# Patient Record
Sex: Female | Born: 2000
Health system: Western US, Academic
[De-identification: ages and names within clinical notes are randomized; demographics above are authoritative.]

## PROBLEM LIST (undated history)

## (undated) DIAGNOSIS — K589 Irritable bowel syndrome without diarrhea: Secondary | ICD-10-CM

## (undated) HISTORY — PX: WISDOM TOOTH EXTRACTION: SHX21

## (undated) HISTORY — DX: Irritable bowel syndrome without diarrhea: K58.9

## (undated) HISTORY — PX: ULNAR NERVE REPAIR: SHX2594

## (undated) HISTORY — PX: RHINOPLASTY: SHX2354

---

## 2019-06-29 ENCOUNTER — Other Ambulatory Visit: Payer: Self-pay | Admitting: Primary Care

## 2019-06-29 DIAGNOSIS — Z20822 Contact with and (suspected) exposure to covid-19: Secondary | ICD-10-CM

## 2019-06-30 LAB — NOVEL CORONAVIRUS, NAA: SARS-CoV-2, NAA: NOT DETECTED

## 2020-01-02 ENCOUNTER — Ambulatory Visit: Payer: Self-pay | Attending: Internal Medicine

## 2020-01-02 DIAGNOSIS — Z23 Encounter for immunization: Secondary | ICD-10-CM

## 2020-01-02 NOTE — Progress Notes (Signed)
   Covid-19 Vaccination Clinic  Name:  Latasha Peterson    MRN: [id number] DOB: January 27, 2001  01/02/2020  [name] was observed post Covid-19 immunization for 15 minutes without incident. She was provided with Vaccine Information Sheet and instruction to access the V-Safe system.   [name] was instructed to call 911 with any severe reactions post vaccine: Marland Kitchen Difficulty breathing  . Swelling of face and throat  . A fast heartbeat  . A bad rash all over body  . Dizziness and weakness   Immunizations Administered    Name Date Dose VIS Date Route   Pfizer COVID-19 Vaccine 01/02/2020  3:28 PM 0.3 mL 09/18/2019 Intramuscular   Manufacturer: [employer], [employer]   Lot: [id number]   NDC: [address]-5

## 2020-01-03 ENCOUNTER — Ambulatory Visit: Payer: Self-pay

## 2020-08-29 ENCOUNTER — Ambulatory Visit: Payer: BLUE CROSS/BLUE SHIELD | Attending: Student in an Organized Health Care Education/Training Program

## 2020-08-29 DIAGNOSIS — T7840XD Allergy, unspecified, subsequent encounter: Secondary | ICD-10-CM

## 2020-08-29 DIAGNOSIS — Z0000 Encounter for general adult medical examination without abnormal findings: Secondary | ICD-10-CM

## 2020-08-29 MED ORDER — EPINEPHRINE 0.15 MG/0.3ML IJ SOAJ
0.15 mg | INTRAMUSCULAR | 1 refills | Status: AC | PRN
Start: 2020-08-29 — End: ?

## 2020-08-29 NOTE — Progress Notes
Chelsea Potter Kitchen  [facility]  Outpatient Note  PMD: Leia Alf, MD  08/29/2020    SUBJECTIVE:  Chelsea Potter is a 19 y.o. female who presents for   Chief Complaint   Patient presents with   ??? Establish Care   ??? lightheaded episode     x last week       [id number] is here for annual visit.   Patient is currently in college pre-med, doing well.  Patient has extensive allergies to medication, interested in allergy referral.    Patient Care Team:  Leia Alf, MD as PCP - General      The medical history below was entered and/or reviewed with the patient.    Allergies: Amoxicillin, Oseltamivir, Cefdinir, and Prednisone  Medications:   Patient's last menstrual period was 08/15/2020 (approximate).  OB History   No obstetric history on file.     normal menses, no abnormal bleeding, pelvic pain or discharge,no breast pain or new or enlarging lumps on self exam  Patient denies sexual activity    No past medical history on file.  No past surgical history on file.  Immunization History   Administered Date(s) Administered   ??? COVID-19, mRNA, (Pfizer) 30 mcg/0.3 mL 01/02/2020     Health Maintenance   Topic Date Due   ??? HPV Vaccines (1 - 2-dose series) Never done   ??? Chlamydia/GC Screening  Never done   ??? Hepatitis C Screening  Never done   ??? HIV Screening  Never done   ??? Tdap/Td Vaccine (1 - Tdap) Never done   ??? COVID-19 Vaccine (2 - Pfizer 2-dose series) 01/23/2020   ??? Cervical Ca Screening: PAP Smear  07/30/2020   ??? Influenza Vaccine  Completed     No family history on file.  Social History     Social History Narrative   ??? Not on file     Review of Systems -   A complete 14-system review of system was performed with pertinent positives and negatives included above and in the HPI and otherwise reviewed and found to be negative and/or non-contributory.    OBJECTIVE:  BP 134/83  ~ Pulse 76  ~ Temp 36.4 ???C (97.6 ???F) (Tympanic)  ~ Ht 5' 4.96'' (1.65 m)  ~ Wt 120 lb (54.4 kg)  ~ LMP 08/15/2020 (Approximate)  ~ SpO2 98%  ~ BMI 19.99 kg/m???   General appearance - Alert, non-toxic, well appearing, and in no distress.    Eyes - Pupils are equally round and reactive to light, extra ocular movements are intact, conjunctiva are not injected.  Ears - External ear normal, canal normal with no debris, tympanic membrane translucent without erythema or deformity.  Nose - External nose normal in appearance. Nares patent without discharge or polyps.  Mouth - Mucous membranes moist, pharynx normal without lesions, uvula midline.  Neck - Supple without adenopathy.    Respiratory - Clear to auscultation in anterior and posterior fields, no wheezes, rales or rhonchi, symmetric air movement.  Cardiovascular - Regular rate and rhythm, normal S1 and S2, no murmurs, rubs, clicks or gallops.    Abdomen - Bowel sounds present. Soft, nontender, nondistended, no masses or hepatosplenomegaly.      Back exam - No deformity of spine.   Musculoskeletal - Full range of motion in neck, shoulders, hips and knees.   Extremities - No peripheral edema. Warm and well perfused, capillary refill is less than 3 seconds, no cyanosis. Tibialis anterior  and dorsalis pedis pulses are normal. No clubbing of digits.    Skin - Normal texture  turgor, no rashes, no suspicious skin lesions noted.    Neurological - Alert, oriented. Strength and sensation in upper and lower extremities grossly normal. Biceps and patellar reflexes symmetrical 2+. Normal speech, normal gait.    Psychiatric - Appearance: well dressed, well groomed. Behavior: sits comfortably, answers questions, appropriate eye contact. Attitude: Cooperative. Level of Consciousness: awake and alert. Orientation: Deferred. Speech and Language: Normal rate, rhythm and prosody. Mood: euthymic. Affect: euthymic and consistent with mood. Thought Process/Form: Linear and goal-oriented. Thought Content: As per HPI.  Suicidality and Homicidality: No SI/HI. Insight and Judgment: Fair and appropriate. Attention Span: Normal. Memory: No obvious deficits. Intellectual Functioning: Within normal limits.    Labs:  No results found for this or any previous visit.  Imaging:  No results found.    ASSESSMENT and PLAN:  Pema Thomure is a 19 y.o. female who presents for   Chief Complaint   Patient presents with   ??? Establish Care   ??? lightheaded episode     x last week       ICD-10-CM    1. Allergy, subsequent encounter  T78.40XD Referral to Allergy and Immunology, Medicine     EPINEPHrine (EPIPEN JR) 0.15 mg/0.3 mL injection   2. Well adult exam  Z00.00 CBC & Platelet Count     Comprehensive Metabolic Panel     Lipid Panel     Hgb A1c - HPLC     TSH with reflex FT4, FT3     New or Modified Medications for this Encounter   New    EPINEPHRINE (EPIPEN JR) 0.15 MG/0.3 ML INJECTION    Inject 0.3 mLs (0.15 mg total) into the muscle as needed for for Anaphylaxis.       Associated Diagnoses: Allergy, subsequent encounter    Order Dose: 0.15 mg   Modified    No medications on file   Discontinued Medications    No medications on file     Referral to allergist provided  Epipen provided    F/U labs  Will defer Pap Smear per patient's preference as not ever sexually active    Number and Complexity of Problems Addressed at the Encounter:   Level 4:   []   1 or more chronic illness with exacerbation, progression, or side effects of treatment  []   2 or more stable chronic illness  []   1 undiagnosed new problem with uncertain diagnosis  []   1 acute illness with systemic symptoms  []   1 acute complicated injury   Level 5:   []   1 or more chronic illness with severe exacerbation, progression, or side effects of treatment  []   1 acute or chronic illness or injury that poses a threat to life or bodily function     Review of Data: I have (select 1 out of 3 categories for level 4; 2 out of 3 categories for level 5)  []  Reviewed/ordered []  1 []  2 [x]  ? 3 unique laboratory, radiology, and/or diagnostic tests noted below   []  I have reviewed tests, documents or independent historian(s):  []  Reviewed/ordered ? 3 unique laboratory, radiology, and/or diagnostic tests noted    []  Reviewed prior external notes and incorporated into patient assessment as noted  []  I have independently interpreted test performed by other physician(s) as noted   []  Discussed management or test interpretation with external provider(s) as noted  Risk of Complication and or Morbidity or Mortality of Patient Management including Social Determinants of Health:   []   I deem the above diagnoses to have a risk of complication, morbidity or mortality of []  Minimal   [x]  Low (3)     []  Moderate (4)   []  Severe (5)  []  The diagnosis or treatment of said conditions is significantly limited by social determinants of health as noted.       30  min were spent on this patient visit included physician face-to-face time, preparing to see the patient, counseling and educating patient/family/caregiver, ordering medications/tests/procedures, referring and communicating with other healthcare professionals, documenting clinical information in the EHR and independently interpreting results and communicating results to patient/family/caregiver.          The above plan/recommendation(s) were discussed with the patient. The patient had all questions answered satisfactorily and is in agreement with this recommended plan of care.    # Follow-up  PRN if above symptoms do not resolve or worsen.  No future appointments.    There are no Patient Instructions on file for this visit.    Signed,  Ramiro Harvest, MD   Clinical Instructor  Department of Medicine   1:26 PM 08/29/2020

## 2020-11-21 ENCOUNTER — Ambulatory Visit: Payer: BLUE CROSS/BLUE SHIELD

## 2020-11-21 NOTE — Telephone Encounter
Please advise 

## 2020-12-19 ENCOUNTER — Ambulatory Visit: Payer: BLUE CROSS/BLUE SHIELD | Attending: Student in an Organized Health Care Education/Training Program

## 2020-12-19 DIAGNOSIS — K588 Other irritable bowel syndrome: Secondary | ICD-10-CM

## 2020-12-19 DIAGNOSIS — M940 Chondrocostal junction syndrome [Tietze]: Secondary | ICD-10-CM

## 2020-12-19 DIAGNOSIS — E785 Hyperlipidemia, unspecified: Secondary | ICD-10-CM

## 2020-12-19 MED ORDER — CULTURELLE IMMUNITY SUPPORT PO CAPS
1 | ORAL_CAPSULE | Freq: Every day | ORAL | 3 refills | Status: AC
Start: 2020-12-19 — End: ?

## 2020-12-19 NOTE — Progress Notes
PATIENT: Chelsea Potter  MRN: [id number]  DOB: [birth year]  DATE OF SERVICE: 12/19/2020    CHIEF COMPLAINT:   Chief Complaint   Patient presents with   ??? stomach issues        HPI   [name] is a 20 y.o. female presents for   Chief Complaint   Patient presents with   ??? stomach issues      Patient state she was recently diagnosed w/ costochondritis and treated w/  Meloxicam which has affected her BM. She states in the morning noticed loose stools and then transitions to formed stools. She deneis fever, chills, N/V, hematemsis or hematochezia. 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     Lab review: consistent w/ HLD      MEDS     Medications that the patient states to be currently taking   Medication Sig   ??? azelastine 137 mcg/spray nasal spray    ??? EPINEPHrine (EPIPEN JR) 0.15 mg/0.3 mL injection Inject 0.3 mLs (0.15 mg total) into the muscle as needed for for Anaphylaxis.   ??? Loratadine (CLARITIN PO) Take by mouth.       PHYSICAL EXAM      Last Recorded Vital Signs:    12/19/20 0832   BP: 132/72   Pulse: 92   Temp: 36.6 ???C (97.8 ???F)   SpO2: 99%     Body mass index is 20.62 kg/m???.    System Check if normal Positive or additional negative findings   Constit  [x]  General appearance     Eyes  [x]  Conj/Lids []  Pupils  []  Fundi     HENMT  []  External ears/nose []  Otoscopy   []  Gross Hearing []  Nasal mucosa   []  Lips/teeth/gums []  Oropharynx    []  mucus membranes []  Head     Neck  []  Inspection/palpation []  Thyroid     Resp  []  Effort []  Wheezing    []  Auscultation  []  Crackles     CV  []  Rhythm/rate   []  Murmurs   []  LEE   []  JVP non-elevated    Normal pulses:   []  Radial []  Femoral  []  Pedal     Breast  []  Inspection []  Palpation     GI  []  abd masses    []  tenderness   []  rebound/guarding   []  Liver/spleen []  Rectal     GU  M: []  Scrotum []  Penis []  Prostate   F:  []  External []  vaginal wall        []  Cervix  []  mucus        []  Uterus    []  Adnexa      Lymph  []  Neck []  Axillae []  Groin MSK Specify site examined:    []  Inspect/palp []  ROM   []  Stability []  Strength/tone         Skin  []  Inspection []  Palpation     Neuro  [x]  CN2-12 intact grossly   [x]  Alert and oriented   []  DTR      [x]  Muscle strength      []  Sensation   [x]  Gait/balance     Psych  [x]  Insight/judgement     [x]  Mood/affect    [x]  Gross cognition        LABS/STUDIES   I have:   []  Reviewed/ordered []  1 []  2 [x]  ? 3 unique laboratory, radiology, and/or diagnostic tests noted below   CBC, CMP, HgA1c, Lipid,  TSH  []  Reviewed []  1 []  2 []  ? 3 prior external notes and incorporated into patient assessment    []  Discussed management or test interpretation with external provider(s) as noted       Lab Studies:  Office Visit on 08/29/2020   Component Date Value Ref Range Status   ??? White Blood Cell Count ([facility]) 08/31/2020 7.9  3.8 - 10.8 Thousand/uL Final   ??? Red Blood Cell Count ([facility]) 08/31/2020 4.85  3.80 - 5.10 Million/uL Final   ??? Hemoglobin ([facility]) 08/31/2020 14.1  11.7 - 15.5 g/dL Final   ??? Hematocrit ([facility]) 08/31/2020 43.0  35.0 - 45.0 % Final   ??? MCV ([facility]) 08/31/2020 88.7  80.0 - 100.0 fL Final   ??? MCH ([facility]) 08/31/2020 29.1  27.0 - 33.0 pg Final   ??? MCHC ([facility]) 08/31/2020 32.8  32.0 - 36.0 g/dL Final   ??? RDW ([facility]) 08/31/2020 12.3  11.0 - 15.0 % Final   ??? Platelet Count ([facility]) 08/31/2020 368  140 - 400 Thousand/uL Final   ??? MPV ([facility]) 08/31/2020 10.3  7.5 - 12.5 fL Final   ??? Glucose ([facility]) 08/31/2020 93  65 - 99 mg/dL Final    Comment:               Fasting reference interval        ??? Urea Nitrogen (BUN) ([facility]) 08/31/2020 11  7 - 25 mg/dL Final   ??? Creatinine ([facility]) 08/31/2020 0.88  0.50 - 1.10 mg/dL Final   ??? eGFR Non-Afr. American ([facility]) 08/31/2020 94  > OR = 60 mL/min/1.38m2 Final   ??? eGFR African American ([facility]) 08/31/2020 109  > OR = 60 mL/min/1.39m2 Final   ??? BUN/Creatinine Ratio ([facility]) 08/31/2020 NOT APPLICABLE  6 - 22 (calc) Final   ??? Sodium ([facility]) 08/31/2020 140  135 - 146 mmol/L Final   ??? Potassium ([facility]) 08/31/2020 4.3  3.5 - 5.3 mmol/L Final   ??? Chloride ([facility]) 08/31/2020 102  98 - 110 mmol/L Final   ??? Carbon Dioxide ([facility]) 08/31/2020 28  20 - 32 mmol/L Final   ??? Calcium ([facility]) 08/31/2020 10.3* 8.6 - 10.2 mg/dL Final   ??? Protein, Total ([facility]) 08/31/2020 7.4  6.1 - 8.1 g/dL Final   ??? Albumin ([facility]) 08/31/2020 4.7  3.6 - 5.1 g/dL Final   ??? Globulin ([facility]) 08/31/2020 2.7  1.9 - 3.7 g/dL (calc) Final   ??? Albumin/Globulin Ratio ([facility]) 08/31/2020 1.7  1.0 - 2.5 (calc) Final   ??? Bilirubin, Total ([facility]) 08/31/2020 1.7* 0.2 - 1.2 mg/dL Final   ??? Alkaline Phosphatase ([facility]) 08/31/2020 56  31 - 125 U/L Final   ??? AST ([facility]) 08/31/2020 16  10 - 30 U/L Final   ??? ALT ([facility]) 08/31/2020 15  6 - 29 U/L Final   ??? Cholesterol, Total ([facility]) 08/31/2020 217* <200 mg/dL Final   ??? HDL Cholesterol ([facility]) 08/31/2020 68  > OR = 50 mg/dL Final   ??? Triglycerides ([facility]) 08/31/2020 65  <150 mg/dL Final   ??? LDL-Cholesterol ([facility]) 08/31/2020 133* mg/dL (calc) Final    Comment: Reference range: <100     Desirable range <100 mg/dL for primary prevention;    <70 mg/dL for patients with CHD or diabetic patients   with > or = 2 CHD risk factors.     LDL-C is now calculated using the Martin-Hopkins   calculation, which is a validated novel method providing   better accuracy than the Friedewald equation in the   estimation of  LDL-C.   Horald Pollen et al. Lenox Ahr. 1610;960(45): 2061-2068   (http://education.[facility].com/faq/FAQ164)     ??? Chol/HDLC Ratio ([facility]) 08/31/2020 3.2  <5.0 (calc) Final   ??? Non-HDL Cholesterol ([facility]) 08/31/2020 149* <130 mg/dL (calc) Final    Comment: For patients with diabetes plus 1 major ASCVD risk   factor, treating to a non-HDL-C goal of <100 mg/dL   (LDL-C of <40 mg/dL) is considered a therapeutic   option.     ??? Hemoglobin A1c ([facility]) 08/31/2020 5.1  <5.7 % of total Hgb Final    Comment: For the purpose of screening for the presence of  diabetes:     <5.7%       Consistent with the absence of diabetes  5.7-6.4%    Consistent with increased risk for diabetes              (prediabetes)  > or =6.5%  Consistent with diabetes     This assay result is consistent with a decreased risk  of diabetes.     Currently, no consensus exists regarding use of  hemoglobin A1c for diagnosis of diabetes in children.     According to American Diabetes Association (ADA)  guidelines, hemoglobin A1c <7.0% represents optimal  control in non-pregnant diabetic patients. Different  metrics may apply to specific patient populations.   Standards of Medical Care in Diabetes(ADA).         ??? T3, Free ([facility]) 08/31/2020 3.5  2.3 - 4.2 pg/mL Final   ??? TSH W/Reflex To Ft4 ([facility]) 08/31/2020 2.09  mIU/L Final    Comment:           Reference Range                         > or = 20 Years  0.40-4.50                              Pregnancy Ranges            First trimester    0.26-2.66            Second trimester   0.55-2.73            Third trimester    0.43-2.91         2018 ACC/AHA guidelines recommends that patient is not in statin benefit group. Encourage adherence to heart-healthy lifestyle.    10-Year ASCVD risk cannot be calculated because at least one required variable is not available in [facility] as of 9:02 AM on 12/19/2020.  10-Year ASCVD risk with optimal risk factors cannot be calculated.  Values used to calculate ASCVD score:  Age: 20 y.o. Cannot calculate risk because age is not between 18 and 17 years old.  Gender: Female Race: White or Caucasian  HDL cholesterol: 68 mg/dL. HDL cholesterol measured on 08/31/2020.  Total cholesterol: 217 mg/dL. Total cholesterol measured on 08/31/2020.  LDL cholesterol: 133 mg/dL. LDL cholesterol measured on 08/31/2020.  Systolic BP: 132 mm Hg. BP was measured on 12/19/2020.  The patient is not being treated with a medication that influences SBP.  The patient is currently not a smoker.  The patient does not have a diagnosis of diabetes.  Click here for the [facility] ASCVD Cardiovascular Risk Estimator Plus tool Office manager).    Imaging Studies:   No new imaging reviewed today    A&P   Laqueena Ritter is a 20 y.o. female presenting  for   Chief Complaint   Patient presents with   ??? stomach issues         PROBLEM & ORDERS    ICD-10-CM    1. Other irritable bowel syndrome  K58.8 lactobacillus rhamnosus, GG, CAPS capsule   2. Hyperlipidemia, unspecified hyperlipidemia type  E78.5 Hepatic Funct Panel     Lipid Panel   3. Costochondritis  M94.0        ASSESSMENT  IBS:   Patients varying abdominal pain and changes in BMs likely 2/2 IBS  Counseled patient regarding FODMAPs diet  Provided literature from [facility] and [employer] regarding diet  Provided pro-biotics  RTC in 2 months    HLD:   Counseled regarding diet   Can try red yeast rice extract  Counseled regarding common A/E elevated LFTS  Consider recheck lab draw in 3 months    Costochondritis:   Appreciate management per sports medicine  The above recommendation were discussed with the patient.  The patient has all questions answered satisfactorily and is in agreement with this recommended plan of care.    No follow-ups on file.     Author:  Ramiro Harvest, MD 12/19/2020 9:02 AM

## 2020-12-19 NOTE — Patient Instructions
FODMAPS:  [employer] and [employer] yeast [employer]

## 2020-12-21 ENCOUNTER — Telehealth: Payer: BLUE CROSS/BLUE SHIELD

## 2020-12-21 DIAGNOSIS — K588 Other irritable bowel syndrome: Secondary | ICD-10-CM

## 2020-12-21 MED ORDER — CULTURELLE IMMUNITY SUPPORT PO CAPS
1 | ORAL_CAPSULE | Freq: Every day | ORAL | 3 refills
Start: 2020-12-21 — End: ?

## 2020-12-21 NOTE — Telephone Encounter
Refill Request    Verified patient was seen within the last 6 months. If not seen within 6 months, offered appointment.     Collected prescription information from patient.     Pended orders for provider to review and sign.     Pharmacy location was confirmed and class was set for pended orders.    Patient has been notified of the 24-48 hour turnaround time.      RX never went through electronically in pharmacy

## 2020-12-21 NOTE — Telephone Encounter
[facility]. They need clarification of how many cells you want in the rx ?     Please specify strength/type

## 2020-12-22 ENCOUNTER — Ambulatory Visit: Payer: BLUE CROSS/BLUE SHIELD

## 2021-02-28 ENCOUNTER — Telehealth: Payer: BLUE CROSS/BLUE SHIELD | Attending: Student in an Organized Health Care Education/Training Program

## 2021-02-28 DIAGNOSIS — J069 Acute upper respiratory infection, unspecified: Secondary | ICD-10-CM

## 2021-02-28 NOTE — Progress Notes
RETURN PATIENT TELEMEDICINE VISIT  Provider has determined an electronic visit is appropriate and safe due to the COVID19 crisis    PATIENT:  Chelsea Potter  MRN:  [id number]  DOB:  06/26/2001  DATE OF SERVICE:  02/28/2021  CHIEF COMPLAINT: Cold symptoms      PRIMARY CARE PROVIDER: Ramiro Harvest., MD    Patient Consent to Telehealth Questionnaire   [facility] TELEHEALTH PRECHECKIN QUESTIONS 02/28/2021   By clicking ''I Agree'', I consent to the below:  I Agree     - I agree  to be treated via a video visit and acknowledge that I may be liable for any relevant copays or coinsurance depending on my insurance plan.  - I understand that this video visit is offered for my convenience and I am able to cancel and reschedule for an in-person appointment if I desire.  - I also acknowledge that sensitive medical information may be discussed during this video visit appointment and that it is my responsibility to locate myself in a location that ensures privacy to my own level of comfort.  - I also acknowledge that I should not be participating in a video visit in a way that could cause danger to myself or to those around me (such as driving or walking).  If my provider is concerned about my safety, I understand that they have the right to terminate the visit.     Location of provider: [facility] office  Location of patient: home    Subjective:     Chelsea Potter is a 20 y.o. female with PMHx does not have a problem list on file.. Patient was last seen on 12/19/20; and presents today for video visit to discuss the following issue(s):    Cold symptoms  -Returned to [place] from [facility] via plane flight last Tuesday 02/21/21  -reports nasal congestion, sore throat, cough, ear congestion  -denies fever, chills, n/v/d or other symptoms  -multiple negative COVID home tests  -Tx at home with Dimetapp and Sinus Ex nasal spray    Review of Systems   Per HPI  [x]  10-point ROS reviewed: normal unless stated in HPI    History:   There is no problem list on file for this patient.    History reviewed. No pertinent past medical history.  Current Outpatient Medications   Medication Sig   ??? azelastine 137 mcg/spray nasal spray    ??? EPINEPHrine (EPIPEN JR) 0.15 mg/0.3 mL injection Inject 0.3 mLs (0.15 mg total) into the muscle as needed for for Anaphylaxis.   ??? lactobacillus rhamnosus, GG, CAPS capsule Take 1 capsule by mouth daily.   ??? Loratadine (CLARITIN PO) Take by mouth.     No current facility-administered medications for this visit.     Social History     Socioeconomic History   ??? Marital status: Single   Tobacco Use   ??? Smoking status: Never Smoker   ??? Smokeless tobacco: Never Used   Substance and Sexual Activity   ??? Alcohol use: Yes     Alcohol/week: 1.8 oz     Types: 3 Glasses of wine per week   ??? Drug use: Never     History reviewed. No pertinent family history.    Objective:   Physical Exam:     Vitals: none, given video visit  GEN: sitting in view of the camera in NAD  HEENT: conjunctiva clear, MMM, no rhinorrhea  Neck: trachea midline, full ROM  CV: Appears well-perfused  RESP: no  tachypnea, symmetric chest rise, breathing easily on RA, speaking in full sentences  MSK: normal muscle bulk, no visible deformities  NEURO: alert, interactive, no gross deficits    Labs / Imaging   Lab Studies:  Office Visit on 08/29/2020   Component Date Value Ref Range Status   ??? White Blood Cell Count ([facility]) 08/31/2020 7.9  3.8 - 10.8 Thousand/uL Final   ??? Red Blood Cell Count ([facility]) 08/31/2020 4.85  3.80 - 5.10 Million/uL Final   ??? Hemoglobin ([facility]) 08/31/2020 14.1  11.7 - 15.5 g/dL Final   ??? Hematocrit ([facility]) 08/31/2020 43.0  35.0 - 45.0 % Final   ??? MCV ([facility]) 08/31/2020 88.7  80.0 - 100.0 fL Final   ??? MCH ([facility]) 08/31/2020 29.1  27.0 - 33.0 pg Final   ??? MCHC ([facility]) 08/31/2020 32.8  32.0 - 36.0 g/dL Final   ??? RDW ([facility]) 08/31/2020 12.3  11.0 - 15.0 % Final   ??? Platelet Count ([facility]) 08/31/2020 368  140 - 400 Thousand/uL Final   ??? MPV ([facility]) 08/31/2020 10.3  7.5 - 12.5 fL Final   ??? Glucose ([facility]) 08/31/2020 93  65 - 99 mg/dL Final    Comment:               Fasting reference interval        ??? Urea Nitrogen (BUN) ([facility]) 08/31/2020 11  7 - 25 mg/dL Final   ??? Creatinine ([facility]) 08/31/2020 0.88  0.50 - 1.10 mg/dL Final   ??? eGFR Non-Afr. American ([facility]) 08/31/2020 94  > OR = 60 mL/min/1.91m2 Final   ??? eGFR African American ([facility]) 08/31/2020 109  > OR = 60 mL/min/1.65m2 Final   ??? BUN/Creatinine Ratio ([facility]) 08/31/2020 NOT APPLICABLE  6 - 22 (calc) Final   ??? Sodium ([facility]) 08/31/2020 140  135 - 146 mmol/L Final   ??? Potassium ([facility]) 08/31/2020 4.3  3.5 - 5.3 mmol/L Final   ??? Chloride ([facility]) 08/31/2020 102  98 - 110 mmol/L Final   ??? Carbon Dioxide ([facility]) 08/31/2020 28  20 - 32 mmol/L Final   ??? Calcium ([facility]) 08/31/2020 10.3 (A) 8.6 - 10.2 mg/dL Final   ??? Protein, Total ([facility]) 08/31/2020 7.4  6.1 - 8.1 g/dL Final   ??? Albumin ([facility]) 08/31/2020 4.7  3.6 - 5.1 g/dL Final   ??? Globulin ([facility]) 08/31/2020 2.7  1.9 - 3.7 g/dL (calc) Final   ??? Albumin/Globulin Ratio ([facility]) 08/31/2020 1.7  1.0 - 2.5 (calc) Final   ??? Bilirubin, Total ([facility]) 08/31/2020 1.7 (A) 0.2 - 1.2 mg/dL Final   ??? Alkaline Phosphatase ([facility]) 08/31/2020 56  31 - 125 U/L Final   ??? AST ([facility]) 08/31/2020 16  10 - 30 U/L Final   ??? ALT ([facility]) 08/31/2020 15  6 - 29 U/L Final   ??? Cholesterol, Total ([facility]) 08/31/2020 217 (A) <200 mg/dL Final   ??? HDL Cholesterol ([facility]) 08/31/2020 68  > OR = 50 mg/dL Final   ??? Triglycerides ([facility]) 08/31/2020 65  <150 mg/dL Final   ??? LDL-Cholesterol ([facility]) 08/31/2020 133 (A) mg/dL (calc) Final    Comment: Reference range: <100     Desirable range <100 mg/dL for primary prevention;    <70 mg/dL for patients with CHD or diabetic patients   with > or = 2 CHD risk factors.     LDL-C is now calculated using the Martin-Hopkins   calculation, which is a validated novel method providing   better accuracy than the Friedewald equation in the   estimation of  LDL-C.   Horald Pollen et al. Lenox Ahr. 1478;295(62): 2061-2068   (http://education.[facility].com/faq/FAQ164)     ??? Chol/HDLC Ratio ([facility]) 08/31/2020 3.2  <5.0 (calc) Final   ??? Non-HDL Cholesterol ([facility]) 08/31/2020 149 (A) <130 mg/dL (calc) Final    Comment: For patients with diabetes plus 1 major ASCVD risk   factor, treating to a non-HDL-C goal of <100 mg/dL   (LDL-C of <13 mg/dL) is considered a therapeutic   option.     ??? Hemoglobin A1c ([facility]) 08/31/2020 5.1  <5.7 % of total Hgb Final    Comment: For the purpose of screening for the presence of  diabetes:     <5.7%       Consistent with the absence of diabetes  5.7-6.4%    Consistent with increased risk for diabetes              (prediabetes)  > or =6.5%  Consistent with diabetes     This assay result is consistent with a decreased risk  of diabetes.     Currently, no consensus exists regarding use of  hemoglobin A1c for diagnosis of diabetes in children.     According to American Diabetes Association (ADA)  guidelines, hemoglobin A1c <7.0% represents optimal  control in non-pregnant diabetic patients. Different  metrics may apply to specific patient populations.   Standards of Medical Care in Diabetes(ADA).         ??? T3, Free ([facility]) 08/31/2020 3.5  2.3 - 4.2 pg/mL Final   ??? TSH W/Reflex To Ft4 ([facility]) 08/31/2020 2.09  mIU/L Final    Comment:           Reference Range                         > or = 20 Years  0.40-4.50                              Pregnancy Ranges            First trimester    0.26-2.66            Second trimester   0.55-2.73            Third trimester    0.43-2.91         Imaging Studies:   No imaging has been resulted in the last 30 days      I have:   []  Reviewed/ordered []  1 []  2 [x]  ? 3 unique laboratory, radiology, and/or diagnostic tests noted below    []  Reviewed []  1 []  2 [x]  ? 3 prior external notes and incorporated into patient assessment    []  Discussed management or test interpretation with external provider(s) as noted        Assessment & Plan:     Chelsea Potter is a 20 y.o. female presenting for video visit to discuss cold symptoms      1. Viral URI with cough    While somewhat difficult to fully elucidate patient's symptomatology over telehealth, history and symptomatology consistent with likely viral URI, appreciate multiple negative home COVID tests.  Feel likelihood of bacterial component extremely low at this time.  -discussed supportive care with ibuprofen, Mucinex, nasal saline rinses (ie., Neti pot), moderate fluids, and rest  -discussed masking and excellent hand hygiene  -discussed return precautions    The above plan of care, diagnosis, order, and follow-up were discussed with the patient.  Questions related to this recommended plan of care were answered      Return in about 2 weeks (around 03/14/2021), or if symptoms worsen or fail to improve.      Author: Daria Pastures, MD    02/28/2021 10:49 AM  Family Medicine with Obstetrics  Chelsea Potter  Clinical Instructor, [facility] [employer] [facility] [employer] [facility]  [address], [address]  [place], [place] [address]  Office: [phone]  Fax: [phone]    Please note portions of this note have been composed using FluencyDirect voice dictation software; please excuse any inaccuracies.

## 2021-02-28 NOTE — Patient Instructions
Today you saw Dr. Daria Pastures at [facility]; thank you for trusting [place] with your care.    As a result of today's visit, please consider the following:  -Please continue to wear your mask in public and practice excellent hand hygiene  -You may treat symptoms with ibuprofen, Mucinex, nasal saline rinses (ie., Neti pot), moderate fluids, and rest  -Please proceed to your nearest ER if you develop either or both of the following:     -Fever greater than 100.43F that does not improve with Tylenol     -Significant shortness of breath such that you are winded walking around your home

## 2021-04-24 NOTE — Progress Notes (Signed)
Office Visit Note  Patient: Latasha Peterson             Date of Birth: [birth year]           MRN: [id number]             PCP: Irine Seal, MD Referring: Irine Seal, MD Visit Date: 04/25/2021 Occupation: Pre-med student  Subjective:  New Patient (Initial Visit) (Knot on sternum, abnormal labs)   History of Present Illness: Latasha Peterson is a 20 y.o. female here for sternal pain.  She reports pain in the anterior chest with a palpable bony nodule noticeable for the past about 1.5 years.  Symptoms have increased during the past 6 months with pain intermittently sometimes not bothering her at all but other times she will be unable to raise her arm overhead due to anterior chest pain.  This has been evaluated at several encounters with work-up including ultrasound x-ray and MRI imaging which were all fairly nonspecific thought to represent osteoarthritis or costochondritis.  Lab test check for this problem demonstrated normal sedimentation rate and CRP she had a low positive ANA 1:40. She has tried meloxicam for this problem, that caused exacerbation of her chronic IBS diarrhea predominant symptoms.  Topical Voltaren was not significantly beneficial. 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  She remains physically active exercising regularly with interest in orthopedics and exercise physiology.  She has had previous joint injuries related to these physical activity but never required major procedure or surgery. She has a personal history of allergic reaction to prednisone treatment with which she describes as linear pattern of rashes on her extremities and joint swelling.  Her family history significant for early age of arthritis but no known autoimmune disease.  Activities of Daily Living:  Patient reports morning stiffness for 0  none .   Patient Reports nocnonturnal pain.  Difficulty dressing/grooming:  Denies Difficulty climbing stairs: Denies Difficulty getting out of chair: Denies Difficulty using hands for taps, buttons, cutlery, and/or writing: Denies  Review of Systems  Constitutional:  Negative for fatigue.  HENT:  Negative for mouth dryness.   Eyes:  Negative for dryness.  Respiratory:  Negative for shortness of breath.   Cardiovascular:  Negative for swelling in legs/feet.  Gastrointestinal:  Negative for constipation.  Endocrine: Negative for excessive thirst.  Genitourinary:  Negative for difficulty urinating.  Musculoskeletal:  Negative for joint pain and joint pain.  Skin:  Negative for rash.  Allergic/Immunologic: Negative for susceptible to infections.  Neurological:  Negative for weakness.  Hematological:  Negative for bruising/bleeding tendency.  Psychiatric/Behavioral:  Negative for sleep disturbance.    PMFS History:  Patient Active Problem List   Diagnosis Date Noted   Irritable bowel syndrome (IBS) 04/25/2021   Anterior chest wall pain 04/25/2021   Chronic inflammatory arthritis 04/25/2021   Chronic back pain 04/25/2021   Positive ANA (antinuclear antibody) 04/25/2021    Past Medical History:  Diagnosis Date   IBS (irritable bowel syndrome)     Family History  Problem Relation Age of Onset   Osteoarthritis Mother    Past Surgical History:  Procedure Laterality Date   RHINOPLASTY     ULNAR NERVE REPAIR     WISDOM TOOTH EXTRACTION     Social History   Social History Narrative   Not on file   Immunization History  Administered Date(s) Administered   PFIZER(Purple Top)SARS-COV-2 Vaccination 01/02/2020  Objective: Vital Signs: BP 102/61 (BP Location: Right Arm, Patient Position: Sitting, Cuff Size: Normal)   Pulse 69   Resp 14   Ht 5' 5"  (1.651 m)   Wt 123 lb (55.8 kg)   BMI 20.47 kg/m    Physical Exam HENT:     Right Ear: External ear normal.     Left Ear: External ear normal.     Mouth/Throat:     Mouth: Mucous membranes are  moist.     Pharynx: Oropharynx is clear.  Eyes:     Conjunctiva/sclera: Conjunctivae normal.  Cardiovascular:     Rate and Rhythm: Normal rate and regular rhythm.  Pulmonary:     Effort: Pulmonary effort is normal.     Breath sounds: Normal breath sounds.  Skin:    General: Skin is warm and dry.     Findings: No rash.  Neurological:     Mental Status: She is alert.     Deep Tendon Reflexes: Reflexes normal.  Psychiatric:        Mood and Affect: Mood normal.     Musculoskeletal Exam:  Neck full ROM no tenderness Shoulders full ROM no tenderness or swelling Elbows full ROM no tenderness or swelling Wrists full ROM no tenderness or swelling Fingers full ROM no tenderness or swelling Knees full ROM no tenderness or swelling Ankles full ROM no tenderness or swelling Bony prominence of the manubriosternal joint without tenderness to palpation, ultrasound inspection demonstrates some possible chronic joint capsule thickening without obvious effusion, color doppler enhancement present along inferior border   Investigation: No additional findings.  Imaging: No results found.  Recent Labs: No results found for: WBC, HGB, PLT, NA, K, CL, CO2, GLUCOSE, BUN, CREATININE, BILITOT, ALKPHOS, AST, ALT, PROT, ALBUMIN, CALCIUM, GFRAA, QFTBGOLD, QFTBGOLDPLUS  Speciality Comments: No specialty comments available.  Procedures:  No procedures performed Allergies: Amoxicillin, Oseltamivir, Cefdinir, and Prednisone   Assessment / Plan:     Visit Diagnoses: Anterior chest wall pain Chronic inflammatory arthritis - Plan: HLA-B27 antigen, Rheumatoid factor  Chronic anterior chest pain localized around the manubriosternal joint this sounds inflammatory in character with episodic periods of exacerbation and increased swelling described.  Primary osteoarthritis of the site would be uncommon for a 20 year old and she denies any specific trauma or injury at this site.  Could have had some chronic  overuse with her history of intense physical activity and exercise but seems out of proportion.  We will check HLA-B27 allele symptom could be seen in axial spondylarthritis.  Also rechecking rheumatoid factor.  I would have low threshold for further work-up including whole-body bone scan.  Positive ANA (antinuclear antibody)  Positive ANA at low titer 1:40 no other clinical evidence of inflammatory disease on exam or by history today.  Do not recommend further extensive work-up at this time with low clinical suspicion.  Orders: Orders Placed This Encounter  Procedures   HLA-B27 antigen   Rheumatoid factor   No orders of the defined types were placed in this encounter.    Follow-Up Instructions: No follow-ups on file.   Collier Salina, MD  Note - This record has been created using [employer].  Chart creation errors have been sought, but may not always  have been located. Such creation errors do not reflect on  the standard of medical care.

## 2021-04-25 ENCOUNTER — Encounter: Payer: Self-pay | Admitting: Internal Medicine

## 2021-04-25 ENCOUNTER — Other Ambulatory Visit: Payer: Self-pay

## 2021-04-25 ENCOUNTER — Ambulatory Visit (INDEPENDENT_AMBULATORY_CARE_PROVIDER_SITE_OTHER): Payer: BLUE CROSS/BLUE SHIELD | Admitting: Internal Medicine

## 2021-04-25 VITALS — BP 102/61 | HR 69 | Resp 14 | Ht 65.0 in | Wt 123.0 lb

## 2021-04-25 DIAGNOSIS — M1990 Unspecified osteoarthritis, unspecified site: Secondary | ICD-10-CM | POA: Insufficient documentation

## 2021-04-25 DIAGNOSIS — R768 Other specified abnormal immunological findings in serum: Secondary | ICD-10-CM

## 2021-04-25 DIAGNOSIS — R0789 Other chest pain: Secondary | ICD-10-CM | POA: Diagnosis not present

## 2021-04-25 DIAGNOSIS — M549 Dorsalgia, unspecified: Secondary | ICD-10-CM | POA: Diagnosis not present

## 2021-04-25 DIAGNOSIS — K589 Irritable bowel syndrome without diarrhea: Secondary | ICD-10-CM | POA: Insufficient documentation

## 2021-04-25 DIAGNOSIS — G8929 Other chronic pain: Secondary | ICD-10-CM | POA: Insufficient documentation

## 2021-04-25 NOTE — Patient Instructions (Addendum)
Human Leukocyte Antigen B27 Test Why am I having this test? The human leukocyte antigen B27 (HLA-B27) test is performed to help your health care provider diagnose certain autoimmune diseases, especially an autoimmunedisease called ankylosing spondylitis. Autoimmune diseases are caused by an abnormality in your body's defense (immune) system. Normally, your immune system attacks germs and other things that can make you sick. When you have an autoimmune disease, your immune system attacksthe normal cells of your body instead. Ankylosing spondylitis causes long-term (chronic) pain and stiffness in the spine and hips and can also affect other parts ofthe body, such as the eyes and gastrointestinal tract. What is being tested? This test checks your blood for the presence of HLA-B27, which is a protein found on the surface of white blood cells. HLA-B27 is commonly found in peoplewho have ankylosing spondylitis. What kind of sample is taken?  A blood sample is required for this test. It is usually collected by insertinga needle into a blood vessel. How are the results reported? Your test results will be reported as either positive or negative for thepresence of HLA-B27 in your blood. What do the results mean? A negative result means that you are less likely to have ankylosing spondylitis or certain other autoimmune diseases. However, having a negative result does not mean that you do not have one of these diseases. A positive result means that you are more likely to have: Ankylosing spondylitis. Other autoimmune diseases, such as: Rheumatoid arthritis. Reactive arthritis. Psoriasis or psoriatic arthritis. Inflammation of the eyes (uveitis). Inflammatory bowel disease. HLA-B27 is normally found in a small number of people without a disease. Having HLA-B27 does not always mean that you have an autoimmune disease. Your health care provider will use these test results along with other test results andyour  medical history to make a diagnosis. Talk with your health care provider about what your results mean. Questions to ask your health care provider Ask your health care provider, or the department that is doing the test: When will my results be ready? How will I get my results? What are my treatment options? What other tests do I need? What are my next steps? Summary The human leukocyte antigen B27 (HLA-B27) test is performed to help your health care provider diagnose a condition called ankylosing spondylitis. Ankylosing spondylitis causes long-term (chronic) pain and stiffness in the spine and hips and can also affect other parts of the body, such as the eyes and gastrointestinal tract. A negative result means that you are less likely to have ankylosing spondylitis, while a positive result means that you are more likely to have this condition or another autoimmune condition. Talk with your health care provider about what your results mean. This information is not intended to replace advice given to you by your health care provider. Make sure you discuss any questions you have with your healthcare provider. Document Revised: 08/04/2020 Document Reviewed: 08/04/2020 Elsevier Patient Education  2022 [employer].

## 2021-04-26 ENCOUNTER — Telehealth: Payer: Self-pay | Admitting: Internal Medicine

## 2021-04-26 LAB — RHEUMATOID FACTOR: Rheumatoid fact SerPl-aCnc: <14 IU/mL (ref <14)

## 2021-04-26 LAB — HLA-B27 ANTIGEN: HLA-B27 Antigen: NEGATIVE

## 2021-04-26 NOTE — Progress Notes (Signed)
Lab tests for arthritis related antibodies were negative. I recommend next step to go for a bone scan study to look at areas of joint inflammation involvement like we talked about in clinic. If she agrees with this I will go ahead and order it.

## 2021-04-28 NOTE — Telephone Encounter (Signed)
Duplicate entry

## 2021-05-01 ENCOUNTER — Encounter: Payer: Self-pay | Admitting: Internal Medicine

## 2021-05-02 NOTE — Telephone Encounter (Signed)
I was reviewing ordering for appropriate study and order is now placed today.

## 2021-05-02 NOTE — Addendum Note (Signed)
Addended by: Fuller Plan on: 05/02/2021 08:05 AM   Modules accepted: Orders

## 2021-05-02 NOTE — Progress Notes (Signed)
I was reviewing correct ordering, it is now placed today.

## 2021-05-29 ENCOUNTER — Other Ambulatory Visit: Payer: Self-pay

## 2021-05-29 ENCOUNTER — Encounter (HOSPITAL_COMMUNITY)
Admission: RE | Admit: 2021-05-29 | Discharge: 2021-05-29 | Disposition: A | Discharge status: Home / Self Care | Payer: BLUE CROSS/BLUE SHIELD | Source: Ambulatory Visit | Attending: Internal Medicine | Admitting: Internal Medicine

## 2021-05-29 DIAGNOSIS — M549 Dorsalgia, unspecified: Secondary | ICD-10-CM | POA: Insufficient documentation

## 2021-05-29 DIAGNOSIS — R0789 Other chest pain: Secondary | ICD-10-CM | POA: Diagnosis not present

## 2021-05-29 DIAGNOSIS — M1990 Unspecified osteoarthritis, unspecified site: Secondary | ICD-10-CM | POA: Diagnosis present

## 2021-05-29 DIAGNOSIS — R768 Other specified abnormal immunological findings in serum: Secondary | ICD-10-CM | POA: Diagnosis present

## 2021-05-29 DIAGNOSIS — G8929 Other chronic pain: Secondary | ICD-10-CM | POA: Diagnosis present

## 2021-05-29 IMAGING — NM NM BONE WHOLE BODY
2 series · 2 of 2 positions shown · non-contrast
Comparison: None.

CLINICAL DATA: Manubriosternal arthritis, suspect axial arthropathy
or SAPHO syndrome.

EXAM:
NUCLEAR MEDICINE WHOLE BODY BONE SCAN
TECHNIQUE: Whole body anterior and posterior images were obtained approximately
3 hours after intravenous injection of radiopharmaceutical.
RADIOPHARMACEUTICALS:  20.6 mCi [51] MDP IV

[Series 1: whole body · 2.66mm/px · 1 of 1 slices shown (1 of 2)]
[im 1/1]
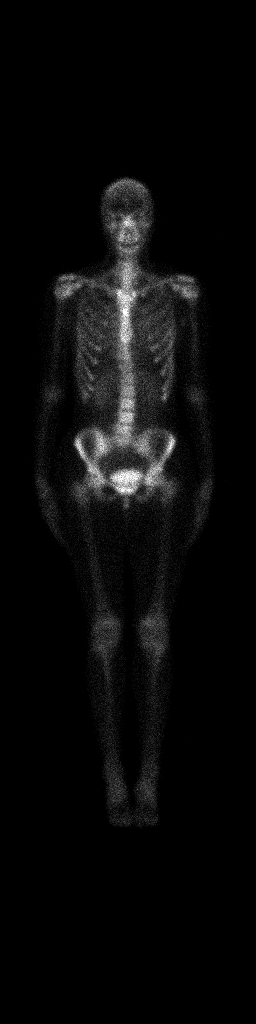

[Series 1: whole body · 2.66mm/px · 1 of 1 slices shown (2 of 2)]
[im 1/1]
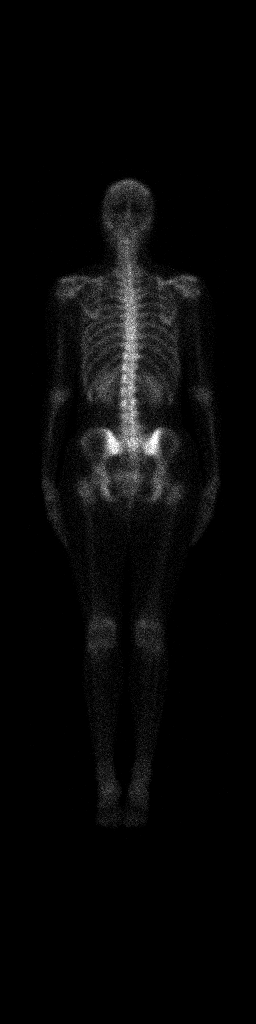

[2 of 2 positions shown; findings below may reference images not displayed]

FINDINGS: There is mild, symmetric focal radiotracer uptake of the bilateral
sternoclavicular joints, as well as a subtle focus of radiotracer
uptake at the sternomanubrial junction. No other abnormal
radiotracer uptake. Expected urinary tract uptake and excretion.
IMPRESSION: There is mild, symmetric focal radiotracer uptake of the bilateral
sternoclavicular joints, as well as a subtle focus of radiotracer
uptake at the sternomanubrial junction. Findings are nonspecific but
potentially in keeping with suspicion for SAPHO syndrome

## 2021-05-29 MED ORDER — TECHNETIUM TC 99M MEDRONATE IV KIT
20.0000 | PACK | Freq: Once | INTRAVENOUS | Status: AC | PRN
  Administered 2021-05-29: 20 via INTRAVENOUS

## 2021-06-05 ENCOUNTER — Encounter: Payer: Self-pay | Admitting: Internal Medicine

## 2021-06-07 NOTE — Telephone Encounter (Signed)
I recommend we schedule a follow up visit I would like to take another look and discuss findings also need to discuss medications for treatment options.

## 2021-06-18 NOTE — Progress Notes (Signed)
Office Visit Note  Patient: Latasha Peterson             Date of Birth: 03-05-2001           MRN: [id number]             PCP: Gust Rung, MD Referring: Gust Rung, MD Visit Date: 06/19/2021   Subjective:  Follow-up (Patient is here to review results of bone scan.)   History of Present Illness: Latasha Peterson is a 20 y.o. female here for follow up for chronic manubriosternal pain with nonspecific although possibly consistent with auto inflammatory disease activity on bone scan. No other areas of inflammatory activity were noted.  Her symptoms remain about the same with a very localized pain in the center of the chest sometimes aggravating her with direct pressure or when having to lift significant weight with her shoulders.  Previous HPI 04/25/21 Latasha Peterson is a 20 y.o. female here for sternal pain.  She reports pain in the anterior chest with a palpable bony nodule noticeable for the past about 1.5 years.  Symptoms have increased during the past 6 months with pain intermittently sometimes not bothering her at all but other times she will be unable to raise her arm overhead due to anterior chest pain.  This has been evaluated at several encounters with work-up including ultrasound x-ray and MRI imaging which were all fairly nonspecific thought to represent osteoarthritis or costochondritis.  Lab test check for this problem demonstrated normal sedimentation rate and CRP she had a low positive ANA 1:40. She has tried meloxicam for this problem, that caused exacerbation of her chronic IBS diarrhea predominant symptoms.  Topical Voltaren was not significantly beneficial. 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  She remains physically active exercising regularly with interest in orthopedics and exercise physiology.  She has had previous joint injuries related to these physical activity but never required major  procedure or surgery. She has a personal history of allergic reaction to prednisone treatment with which she describes as linear pattern of rashes on her extremities and joint swelling.  Her family history significant for early age of arthritis but no known autoimmune disease.   Review of Systems  Constitutional:  Negative for fatigue.  HENT:  Negative for mouth sores, mouth dryness and nose dryness.   Eyes:  Negative for pain, itching, visual disturbance and dryness.  Respiratory:  Negative for cough, hemoptysis, shortness of breath and difficulty breathing.   Cardiovascular:  Negative for chest pain, palpitations and swelling in legs/feet.  Gastrointestinal:  Negative for abdominal pain, blood in stool, constipation and diarrhea.  Endocrine: Negative for increased urination.  Genitourinary:  Negative for painful urination.  Musculoskeletal:  Positive for joint pain and joint pain. Negative for joint swelling, myalgias, muscle weakness, morning stiffness, muscle tenderness and myalgias.  Skin:  Negative for color change, rash and redness.  Allergic/Immunologic: Negative for susceptible to infections.  Neurological:  Negative for dizziness, numbness, headaches, memory loss and weakness.  Hematological:  Negative for swollen glands.  Psychiatric/Behavioral:  Negative for confusion and sleep disturbance.    PMFS History:  Patient Active Problem List   Diagnosis Date Noted   High risk medication use 06/19/2021   Irritable bowel syndrome (IBS) 04/25/2021   Anterior chest wall pain 04/25/2021   Chronic inflammatory arthritis 04/25/2021   Chronic back pain 04/25/2021   Positive ANA (antinuclear antibody) 04/25/2021  Past Medical History:  Diagnosis Date   IBS (irritable bowel syndrome)     Family History  Problem Relation Age of Onset   Osteoarthritis Mother    Past Surgical History:  Procedure Laterality Date   RHINOPLASTY     ULNAR NERVE REPAIR     WISDOM TOOTH EXTRACTION      Social History   Social History Narrative   Not on file   Immunization History  Administered Date(s) Administered   PFIZER(Purple Top)SARS-COV-2 Vaccination 01/02/2020     Objective: Vital Signs: BP 123/77 (BP Location: Left Arm, Patient Position: Sitting, Cuff Size: Normal)   Pulse 72   Ht 5' 5.5" (1.664 m)   Wt 123 lb 9.6 oz (56.1 kg)   BMI 20.26 kg/m    Physical Exam Skin:    General: Skin is warm and dry.     Findings: No rash.  Neurological:     Mental Status: She is alert.  Psychiatric:        Mood and Affect: Mood normal.     Musculoskeletal Exam:  Bony prominence of rmanubriosternal joint without significant tenderness to palpation, no erythema or warmth or overlying swelling   Investigation: No additional findings.  Imaging: No results found.  Recent Labs: Lab Results  Component Value Date   NA 139 06/19/2021   K 4.3 06/19/2021   CL 101 06/19/2021   CO2 29 06/19/2021   GLUCOSE 83 06/19/2021   BUN 14 06/19/2021   CREATININE 1.02 (H) 06/19/2021   BILITOT 1.4 (H) 06/19/2021   AST 21 06/19/2021   ALT 20 06/19/2021   PROT 8.1 06/19/2021   CALCIUM 10.3 (H) 06/19/2021   QFTBGOLDPLUS NEGATIVE 06/19/2021    Speciality Comments: No specialty comments available.  Procedures:  Small Joint Inj on 06/19/2021 3:30 PM Indications: pain Details: 25 G needle, ultrasound-guided (anterior) approach Medications: 0.25 mL lidocaine 1 %; 10 mg triamcinolone acetonide 40 MG/ML Outcome: tolerated well, no immediate complications  Needle difficult to advance into joint space due to marginal bone spurring, approximately 0.5 mL total volume was able to be injected Consent was given by the patient. Immediately prior to procedure a time out was called to verify the correct patient, procedure, equipment, support staff and site/side marked as required. Patient was prepped and draped in the usual sterile fashion.    Allergies: Amoxicillin, Oseltamivir, Cefdinir, and  Prednisone   Assessment / Plan:     Visit Diagnoses: Positive ANA (antinuclear antibody)  Anterior chest wall pain - Plan: Small Joint Inj  Findings remain very suggestive for an inflammatory arthritis but is localized to single joint involvement based on symptoms and the bone scan test.  So far chronic use of nonsteroidal anti-inflammatories has not been satisfactory results.  Discussed local versus systemic treatments after discussion procedure for local steroid joint injection.  We will follow-up in about 4 weeks to check on the response to this treatment.  High risk medication use - Plan: Hepatitis B surface antigen, Hepatitis B core antibody, IgM, Hepatitis C antibody, QuantiFERON-TB Gold Plus, COMPLETE METABOLIC PANEL WITH GFR  Discussed if symptoms are inadequately improved by local steroid injection or nonsteroidal anti-inflammatories would be candidate for trial of DMARD treatment.  We will check baseline labs including hepatitis serology complete metabolic panel and TB screening for this.  Orders: Orders Placed This Encounter  Procedures   Small Joint Inj   Hepatitis B surface antigen   Hepatitis B core antibody, IgM   Hepatitis C antibody   QuantiFERON-TB Gold  Plus   COMPLETE METABOLIC PANEL WITH GFR    No orders of the defined types were placed in this encounter.    Follow-Up Instructions: Return in about 4 weeks (around 07/17/2021).   Fuller Plan, MD  Note - This record has been created using [employer].  Chart creation errors have been sought, but may not always  have been located. Such creation errors do not reflect on  the standard of medical care.

## 2021-06-19 ENCOUNTER — Encounter: Payer: Self-pay | Admitting: Internal Medicine

## 2021-06-19 ENCOUNTER — Ambulatory Visit (INDEPENDENT_AMBULATORY_CARE_PROVIDER_SITE_OTHER): Payer: BLUE CROSS/BLUE SHIELD | Admitting: Internal Medicine

## 2021-06-19 ENCOUNTER — Other Ambulatory Visit: Payer: Self-pay

## 2021-06-19 VITALS — BP 123/77 | HR 72 | Ht 65.5 in | Wt 123.6 lb

## 2021-06-19 DIAGNOSIS — Z79899 Other long term (current) drug therapy: Secondary | ICD-10-CM | POA: Diagnosis not present

## 2021-06-19 DIAGNOSIS — R768 Other specified abnormal immunological findings in serum: Secondary | ICD-10-CM | POA: Diagnosis not present

## 2021-06-19 DIAGNOSIS — M1990 Unspecified osteoarthritis, unspecified site: Secondary | ICD-10-CM | POA: Diagnosis not present

## 2021-06-19 DIAGNOSIS — R0789 Other chest pain: Secondary | ICD-10-CM

## 2021-06-19 NOTE — Patient Instructions (Signed)
Joint Steroid Injection A joint steroid injection is a procedure to relieve swelling and pain in a joint. Steroids are medicines that reduce inflammation. In this procedure, your health care provider uses a syringe and a needle to inject a steroid medicine into a painful and inflamed joint. A pain-relieving medicine (anesthetic) may be injected along with the steroid. In some cases, your health care provider may use an imaging technique such as ultrasound or fluoroscopy toguide the injection. Joints that are often treated with steroid injections include the knee, shoulder, hip, and spine. These injections may also be used in the elbow, ankle, and joints of the hands or feet. You may have joint steroid injections as part of your treatment for inflammation caused by: Gout. Rheumatoid arthritis. Advanced wear-and-tear arthritis (osteoarthritis). Tendinitis. Bursitis. Joint steroid injections may be repeated, but having them too often can damage a joint or the skin over the joint. You should not have joint steroidinjections less than 6 weeks apart or more than four times a year. Tell a health care provider about: Any allergies you have. All medicines you are taking, including vitamins, herbs, eye drops, creams, and over-the-counter medicines. Any problems you or family members have had with anesthetic medicines. Any blood disorders you have. Any surgeries you have had. Any medical conditions you have. Whether you are pregnant or may be pregnant. What are the risks? Generally, this is a safe treatment. However, problems may occur, including: Infection. Bleeding. Allergic reactions to medicines. Damage to the joint or tissues around the joint. Thinning of skin or loss of skin color over the joint. Temporary flushing of the face or chest. Temporary increase in pain. Temporary increase in blood sugar. Failure to relieve inflammation or pain. What happens before the treatment? Medicines Ask  your health care provider about: Changing or stopping your regular medicines. This is especially important if you are taking diabetes medicines or blood thinners. Taking medicines such as aspirin and ibuprofen. These medicines can thin your blood. Do not take these medicines unless your health care provider tells you to take them. Taking over-the-counter medicines, vitamins, herbs, and supplements. General instructions You may have imaging tests of your joint. Ask your health care provider if you can drive yourself home after the procedure. What happens during the treatment?  Your health care provider will position you for the injection and locate the injection site over your joint. The skin over the joint will be cleaned with a germ-killing soap. Your health care provider may: Spray a numbing solution (topical anesthetic) over the injection site. Inject a local anesthetic under the skin above your joint. The needle will be placed through your skin into your joint. Your health care provider may use imaging to guide the needle to the right spot for the injection. If imaging is used, a special contrast dye may be injected to confirm that the needle is in the correct location. The steroid medicine will be injected into your joint. Anesthetic may be injected along with the steroid. This may be a medicine that relieves pain for a short time (short-acting anesthetic) or for a longer time (long-acting anesthetic). The needle will be removed, and an adhesive bandage (dressing) will be placed over the injection site. The procedure may vary among health care providers and hospitals. What can I expect after the treatment? You will be able to go home after the treatment. It is normal to feel slight flushing for a few days after the injection. After the treatment, it is common to   have an increase in joint pain after the anesthetic has worn off. This may happen about an hour after a short-acting anesthetic  or about 8 hours after a longer-acting anesthetic. You should begin to feel relief from joint pain and swelling after 24 to 48 hours. Contact your health care provider if you do not begin to feel relief after 2 days. Follow these instructions at home: Injection site care Leave the adhesive dressing over your injection site in place until your health care provider says you can remove it. Check your injection site every day for signs of infection. Check for: More redness, swelling, or pain. Fluid or blood. Warmth. Pus or a bad smell. Activity Return to your normal activities as told by your health care provider. Ask your health care provider what activities are safe for you. You may be asked to limit activities that put stress on the joint for a few days. Do joint exercises as told by your health care provider. Do not take baths, swim, or use a hot tub until your health care provider approves. Ask your health care provider if you may take showers. You may only be allowed to take sponge baths. Managing pain, stiffness, and swelling  If directed, put ice on the joint. To do this: Put ice in a plastic bag. Place a towel between your skin and the bag. Leave the ice on for 20 minutes, 2-3 times a day. Remove the ice if your skin turns bright red. This is very important. If you cannot feel pain, heat, or cold, you have a greater risk of damage to the area. Raise (elevate) your joint above the level of your heart when you are sitting or lying down.  General instructions Take over-the-counter and prescription medicines only as told by your health care provider. Do not use any products that contain nicotine or tobacco, such as cigarettes, e-cigarettes, and chewing tobacco. These can delay joint healing. If you need help quitting, ask your health care provider. If you have diabetes, be aware that your blood sugar may be slightly elevated for several days after the injection. Keep all follow-up visits.  This is important. Contact a health care provider if you have: Chills or a fever. Any signs of infection at your injection site. Increased pain or swelling or no relief after 2 days. Summary A joint steroid injection is a treatment to relieve pain and swelling in a joint. Steroids are medicines that reduce inflammation. Your health care provider may add an anesthetic along with the steroid. You may have joint steroid injections as part of your arthritis treatment. Joint steroid injections may be repeated, but having them too often can damage a joint or the skin over the joint. Contact your health care provider if you have a fever, chills, or signs of infection, or if you get no relief from joint pain or swelling. This information is not intended to replace advice given to you by your health care provider. Make sure you discuss any questions you have with your healthcare provider. Document Revised: 03/04/2020 Document Reviewed: 03/04/2020 Elsevier Patient Education  2022 [employer]  

## 2021-06-22 LAB — HEPATITIS B CORE ANTIBODY, IGM: Hep B C IgM: NONREACTIVE

## 2021-06-22 LAB — COMPLETE METABOLIC PANEL WITH GFR
AG Ratio: 1.9 (calc) (ref 1.0–2.5)
ALT: 20 U/L (ref 6–29)
AST: 21 U/L (ref 10–30)
Albumin: 5.3 g/dL — ABNORMAL HIGH (ref 3.6–5.1)
Alkaline phosphatase (APISO): 66 U/L (ref 31–125)
BUN/Creatinine Ratio: 14 (calc) (ref 6–22)
BUN: 14 mg/dL (ref 7–25)
CO2: 29 mmol/L (ref 20–32)
Calcium: 10.3 mg/dL — ABNORMAL HIGH (ref 8.6–10.2)
Chloride: 101 mmol/L (ref 98–110)
Creat: 1.02 mg/dL — ABNORMAL HIGH (ref 0.50–0.96)
Globulin: 2.8 g/dL (calc) (ref 1.9–3.7)
Glucose, Bld: 83 mg/dL (ref 65–99)
Potassium: 4.3 mmol/L (ref 3.5–5.3)
Sodium: 139 mmol/L (ref 135–146)
Total Bilirubin: 1.4 mg/dL — ABNORMAL HIGH (ref 0.2–1.2)
Total Protein: 8.1 g/dL (ref 6.1–8.1)
eGFR: 80 mL/min/{1.73_m2} (ref >=60)

## 2021-06-22 LAB — HEPATITIS B SURFACE ANTIGEN: Hepatitis B Surface Ag: NONREACTIVE

## 2021-06-22 LAB — QUANTIFERON-TB GOLD PLUS
Mitogen-NIL: 5.07 IU/mL
NIL: 0.03 IU/mL
QuantiFERON-TB Gold Plus: NEGATIVE
TB1-NIL: 0.01 IU/mL
TB2-NIL: 0.01 IU/mL

## 2021-06-22 LAB — HEPATITIS C ANTIBODY
Hepatitis C Ab: NONREACTIVE
SIGNAL TO CUT-OFF: 0.01 (ref <1.00)

## 2021-07-08 MED ORDER — LIDOCAINE HCL 1 % IJ SOLN
0.2500 mL | INTRAMUSCULAR | Status: AC | PRN
  Administered 2021-06-19: .25 mL

## 2021-07-08 MED ORDER — TRIAMCINOLONE ACETONIDE 40 MG/ML IJ SUSP
10.0000 mg | INTRAMUSCULAR | Status: AC | PRN
  Administered 2021-06-19: 10 mg via INTRA_ARTICULAR

## 2021-07-17 ENCOUNTER — Encounter: Payer: Self-pay | Admitting: Emergency Medicine

## 2021-07-17 ENCOUNTER — Ambulatory Visit
Admission: EM | Admit: 2021-07-17 | Discharge: 2021-07-17 | Disposition: A | Discharge status: Home / Self Care | Payer: BLUE CROSS/BLUE SHIELD | Attending: Emergency Medicine | Admitting: Emergency Medicine

## 2021-07-17 ENCOUNTER — Other Ambulatory Visit: Payer: Self-pay

## 2021-07-17 DIAGNOSIS — B9689 Other specified bacterial agents as the cause of diseases classified elsewhere: Secondary | ICD-10-CM | POA: Diagnosis not present

## 2021-07-17 DIAGNOSIS — J329 Chronic sinusitis, unspecified: Secondary | ICD-10-CM | POA: Diagnosis not present

## 2021-07-17 MED ORDER — DOXYCYCLINE HYCLATE 100 MG PO CAPS: 100.0000 mg | ORAL_CAPSULE | Freq: Two times a day (BID) | ORAL | 0 refills | Status: AC

## 2021-07-17 NOTE — ED Provider Notes (Signed)
CHIEF COMPLAINT:   Chief Complaint  Patient presents with   Nasal Congestion   Facial Pain     SUBJECTIVE/HPI:  HPI A very pleasant 19 y.o.Female presents today with sinus congestion and drainage along with pressure over the last week. Patient does not report any shortness of breath, chest pain, palpitations, visual changes, weakness, tingling, headache, nausea, vomiting, diarrhea, fever, chills.   has a past medical history of IBS (irritable bowel syndrome).  ROS:  Review of Systems See Subjective/HPI Medications, Allergies and Problem List personally reviewed in Epic today OBJECTIVE:   Vitals:   07/17/21 1934  BP: 131/87  Pulse: 75  Resp: 20  Temp: 98.2 F (36.8 C)  SpO2: 98%    Physical Exam   General: Appears well-developed and well-nourished. No acute distress.  HEENT Head: Normocephalic and atraumatic.  + frontal, maxillary sinus tenderness noted to palpation. Ears: Hearing grossly intact, no drainage or visible deformity.  Nose: No nasal deviation.   Mouth/Throat: No stridor or tracheal deviation.  Non erythematous posterior pharynx noted with clear drainage present.  No white patchy exudate noted. Eyes: Conjunctivae and EOM are normal. No eye drainage or scleral icterus bilaterally.  Neck: Normal range of motion, neck is supple.  Cardiovascular: Normal rate. Pulm/Chest: No respiratory distress. Neurological: Alert and oriented to person, place, and time.  Skin: Skin is warm and dry.  No rashes, lesions, abrasions or bruising noted to skin.   Psychiatric: Normal mood, affect, behavior, and thought content.   Vital signs and nursing note reviewed.   Patient stable and cooperative with examination. PROCEDURES:    LABS/X-RAYS/EKG/MEDS:   No results found for any visits on 07/17/21.  MEDICAL DECISION MAKING:   Patient presents with sinus congestion and drainage along with pressure over the last week. Patient does not report any shortness of breath, chest  pain, palpitations, visual changes, weakness, tingling, headache, nausea, vomiting, diarrhea, fever, chills.  Given symptoms along with assessment findings, likely bacterial sinusitis.  Rx doxycycline to the patient's preferred pharmacy as she is unable to take amoxicillin and cefdinir.  Advised about home treatment and care to include Tylenol, ibuprofen, Sudafed, Mucinex, rest and fluids.  Advised to return to clinic for any new or worse swelling or redness in her face, around her eyes or new high fever.  Patient verbalized understanding and agreed with treatment plan.  Patient stable upon discharge. ASSESSMENT/PLAN:  1. Bacterial sinusitis - doxycycline (VIBRAMYCIN) 100 MG capsule; Take 1 capsule (100 mg total) by mouth 2 (two) times daily for 7 days.  Dispense: 14 capsule; Refill: 0 Instructions about new medications and side effects provided.  Plan:   Discharge Instructions      Sinusitis is an infection of the lining of the sinus cavities in your head. Sinusitis often follows a cold. It causes pain and pressure in your head and face. Take antibiotics (Doxycycline) as directed. Do not stop taking them just because you feel better. You need to take the full course of antibiotics. Rest, push lots of fluids (especially water), and utilize supportive care for symptoms. Breathe warm, moist area from a steamy shower, hot bath, or sink filled with hot water.  Avoid cold, dry air.  Using a humidifier in your home may help.  Follow the directions for cleaning the machine. Put a hot, wet towel or a warm gel pack on your face 3-4 times a day for 5-10 minutes each time. You may take acetaminophen (Tylenol) every 4-6 hours and ibuprofen every 6-8 hours for  muscle pain, joint pain, headaches (you may also alternate these medications). Sudafed (pseudophedrine) is sold behind the counter and can help reduce nasal pressure; avoid taking this if you have high blood pressure or feel jittery. Sudafed PE  (phenylephrine) can be a helpful, short-term, over-the-counter alternative to limit side effects or if you have high blood pressure.  Flonase nasal spray can help alleviate congestion and sinus pressure. Many patients choose Afrin as a nasal decongestant; do not use for more than 3 days for risk of rebound (increased symptoms after stopping medication).  Saline nasal sprays or rinses can also help nasal congestion (use bottled or sterile water). Warm tea with lemon and honey can sooth sore throat and cough, as can cough drops.   Return to clinic for new or worse swelling or redness in your face or around your eyes, or if you have a new or higher fever.          Amalia Greenhouse, FNP 07/17/21 1958

## 2021-07-17 NOTE — Discharge Instructions (Signed)
Sinusitis is an infection of the lining of the sinus cavities in your head. Sinusitis often follows a cold. It causes pain and pressure in your head and face. Take antibiotics (Doxycycline) as directed. Do not stop taking them just because you feel better. You need to take the full course of antibiotics. Rest, push lots of fluids (especially water), and utilize supportive care for symptoms. Breathe warm, moist area from a steamy shower, hot bath, or sink filled with hot water.  Avoid cold, dry air.  Using a humidifier in your home may help.  Follow the directions for cleaning the machine. Put a hot, wet towel or a warm gel pack on your face 3-4 times a day for 5-10 minutes each time. You may take acetaminophen (Tylenol) every 4-6 hours and ibuprofen every 6-8 hours for muscle pain, joint pain, headaches (you may also alternate these medications). Sudafed (pseudophedrine) is sold behind the counter and can help reduce nasal pressure; avoid taking this if you have high blood pressure or feel jittery. Sudafed PE (phenylephrine) can be a helpful, short-term, over-the-counter alternative to limit side effects or if you have high blood pressure.  Flonase nasal spray can help alleviate congestion and sinus pressure. Many patients choose Afrin as a nasal decongestant; do not use for more than 3 days for risk of rebound (increased symptoms after stopping medication).  Saline nasal sprays or rinses can also help nasal congestion (use bottled or sterile water). Warm tea with lemon and honey can sooth sore throat and cough, as can cough drops.   Return to clinic for new or worse swelling or redness in your face or around your eyes, or if you have a new or higher fever.  

## 2021-07-17 NOTE — ED Triage Notes (Signed)
Pt here with sinus congestion and drainage with sinus pressure over 1 week.

## 2021-07-18 ENCOUNTER — Ambulatory Visit: Payer: BLUE CROSS/BLUE SHIELD

## 2021-07-20 ENCOUNTER — Ambulatory Visit (INDEPENDENT_AMBULATORY_CARE_PROVIDER_SITE_OTHER): Payer: BLUE CROSS/BLUE SHIELD | Admitting: Internal Medicine

## 2021-07-20 ENCOUNTER — Other Ambulatory Visit: Payer: Self-pay

## 2021-07-20 ENCOUNTER — Encounter: Payer: Self-pay | Admitting: Internal Medicine

## 2021-07-20 VITALS — BP 98/64 | HR 66 | Resp 14 | Ht 65.5 in | Wt 125.0 lb

## 2021-07-20 DIAGNOSIS — M1990 Unspecified osteoarthritis, unspecified site: Secondary | ICD-10-CM | POA: Diagnosis not present

## 2021-07-20 DIAGNOSIS — R0789 Other chest pain: Secondary | ICD-10-CM | POA: Diagnosis not present

## 2021-07-20 NOTE — Progress Notes (Signed)
Office Visit Note  Patient: Latasha Peterson             Date of Birth: Oct 23, 2000           MRN: [id number]             PCP: Gust Rung, MD Referring: Gust Rung, MD Visit Date: 07/20/2021   Subjective:  Follow-up (Improving)   History of Present Illness: Latasha Peterson is a 20 y.o. female here for follow up for manubriosternal arthritis after local steroid injection one month ago. She feels her pain is partially improved after the injection, but still feels some pain on some other days. No return ot noticeable swelling. She also developed sinusitis and currently taking doxycycline for this beginning to improve today.  Previous HPI 06/19/21 Latasha Peterson is a 20 y.o. female here for follow up for chronic manubriosternal pain with nonspecific although possibly consistent with auto inflammatory disease activity on bone scan. No other areas of inflammatory activity were noted.  Her symptoms remain about the same with a very localized pain in the center of the chest sometimes aggravating her with direct pressure or when having to lift significant weight with her shoulders.   Previous HPI 04/25/21 Latasha Peterson is a 21 y.o. female here for sternal pain.  She reports pain in the anterior chest with a palpable bony nodule noticeable for the past about 1.5 years.  Symptoms have increased during the past 6 months with pain intermittently sometimes not bothering her at all but other times she will be unable to raise her arm overhead due to anterior chest pain.  This has been evaluated at several encounters with work-up including ultrasound x-ray and MRI imaging which were all fairly nonspecific thought to represent osteoarthritis or costochondritis.  Lab test check for this problem demonstrated normal sedimentation rate and CRP she had a low positive ANA 1:40. She has tried meloxicam for this problem, that caused exacerbation of her chronic IBS diarrhea predominant symptoms.  Topical Voltaren  was not significantly beneficial. 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  She remains physically active exercising regularly with interest in orthopedics and exercise physiology.  She has had previous joint injuries related to these physical activity but never required major procedure or surgery. She has a personal history of allergic reaction to prednisone treatment with which she describes as linear pattern of rashes on her extremities and joint swelling.  Her family history significant for early age of arthritis but no known autoimmune disease.   Review of Systems  Constitutional:  Negative for fatigue.  HENT:  Negative for mouth dryness.   Eyes:  Negative for dryness.  Respiratory:  Negative for shortness of breath.   Cardiovascular:  Negative for swelling in legs/feet.  Gastrointestinal:  Negative for constipation.  Endocrine: Negative for excessive thirst.  Genitourinary:  Negative for difficulty urinating.  Musculoskeletal:  Negative for joint swelling.  Skin:  Negative for rash.  Allergic/Immunologic: Negative for susceptible to infections.  Neurological:  Negative for numbness.  Hematological:  Negative for bruising/bleeding tendency.  Psychiatric/Behavioral:  Negative for sleep disturbance.    PMFS History:  Patient Active Problem List   Diagnosis Date Noted   Irritable bowel syndrome (IBS) 04/25/2021   Anterior chest wall pain 04/25/2021   Chronic inflammatory arthritis 04/25/2021    Past Medical History:  Diagnosis Date   IBS (irritable bowel syndrome)     Family  History  Problem Relation Age of Onset   Osteoarthritis Mother    Past Surgical History:  Procedure Laterality Date   RHINOPLASTY     ULNAR NERVE REPAIR     WISDOM TOOTH EXTRACTION     Social History   Social History Narrative   Not on file   Immunization History  Administered Date(s) Administered   PFIZER(Purple  Top)SARS-COV-2 Vaccination 01/02/2020     Objective: Vital Signs: BP 98/64 (BP Location: Right Arm, Patient Position: Sitting, Cuff Size: Normal)   Pulse 66   Resp 14   Ht 5' 5.5" (1.664 m)   Wt 125 lb (56.7 kg)   BMI 20.48 kg/m    Physical Exam Skin:    General: Skin is warm and dry.     Findings: No rash.  Neurological:     Mental Status: She is alert.  Psychiatric:        Mood and Affect: Mood normal.     Musculoskeletal Exam:  Bony proiminence of manubriosternal joint without any overlying swelling or erythema, no tenderness to palpation over Browning joints or clavicle, no pain provoked with resisted abduction   Investigation: No additional findings.  Imaging: No results found.  Recent Labs: Lab Results  Component Value Date   NA 139 06/19/2021   K 4.3 06/19/2021   CL 101 06/19/2021   CO2 29 06/19/2021   GLUCOSE 83 06/19/2021   BUN 14 06/19/2021   CREATININE 1.02 (H) 06/19/2021   BILITOT 1.4 (H) 06/19/2021   AST 21 06/19/2021   ALT 20 06/19/2021   PROT 8.1 06/19/2021   CALCIUM 10.3 (H) 06/19/2021   QFTBGOLDPLUS NEGATIVE 06/19/2021    Speciality Comments: No specialty comments available.  Procedures:  No procedures performed Allergies: Amoxicillin, Oseltamivir, Cefdinir, and Prednisone   Assessment / Plan:     Visit Diagnoses: Anterior chest wall pain Chronic inflammatory arthritis  Symptoms remain localized to manubriosternal joint and has been partially improved after local steroid injection.  No swelling at this time.  We will plan to continue just observing for return of symptoms or not, would not repeat injection at less than 3 month interval.  She describes an upcoming trip to [place] planned is concerned about possible exacerbation during that time. I recommend one option would be trial or short corticosteroid taper to have available as needed.  Orders: No orders of the defined types were placed in this encounter.  No orders of the defined  types were placed in this encounter.    Follow-Up Instructions: Return in about 6 months (around 01/18/2022) for SM arthritis inj/obs f/u 28mos.   Fuller Plan, MD  Note - This record has been created using [employer].  Chart creation errors have been sought, but may not always  have been located. Such creation errors do not reflect on  the standard of medical care.

## 2021-08-31 ENCOUNTER — Encounter: Payer: Self-pay | Admitting: Internal Medicine

## 2021-09-04 NOTE — Telephone Encounter (Signed)
I am okay to try repeating this injection to see if it has a good benefit again. Can schedule wherever is available in next 2 weeks for injection visit.

## 2021-09-12 NOTE — Progress Notes (Signed)
Office Visit Note  Patient: Latasha Peterson             Date of Birth: 03-03-2001           MRN: [id number]             PCP: Gust Rung, MD Referring: Gust Rung, MD Visit Date: 09/13/2021   Subjective:  Other (Patient reports sternum pain. Patient states she did receive relief with the injection on 06/19/2021. The pain returned last week.)   History of Present Illness: Latasha Peterson is a 20 y.o. female here for follow up for manubrisoternal arthritis after local steroid injection about 3 months ago. She felt symptoms improved after this but are worsening again for the past week. Pain sometimes present with deep inspiration, not much affected with arm exercises or overhead lifting. No significant swelling or discoloration, no new areas of joint pain involvement. She has some ankle pain intermittently she attributes more to dance/use related. She has a major upcoming trip to [place] planned and is concerned for returning symptoms.  Previous HPI 07/20/21 Latasha Peterson is a 20 y.o. female here for follow up for manubriosternal arthritis after local steroid injection one month ago. She feels her pain is partially improved after the injection, but still feels some pain on some other days. No return ot noticeable swelling. She also developed sinusitis and currently taking doxycycline for this beginning to improve today.  Previous HPI 04/25/21 Latasha Peterson is a 20 y.o. female here for sternal pain.  She reports pain in the anterior chest with a palpable bony nodule noticeable for the past about 1.5 years.  Symptoms have increased during the past 6 months with pain intermittently sometimes not bothering her at all but other times she will be unable to raise her arm overhead due to anterior chest pain.  This has been evaluated at several encounters with work-up including ultrasound x-ray and MRI imaging which were all fairly nonspecific thought to represent osteoarthritis or  costochondritis.  Lab test check for this problem demonstrated normal sedimentation rate and CRP she had a low positive ANA 1:40. She has tried meloxicam for this problem, that caused exacerbation of her chronic IBS diarrhea predominant symptoms.  Topical Voltaren was not significantly beneficial. 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  She remains physically active exercising regularly with interest in orthopedics and exercise physiology.  She has had previous joint injuries related to these physical activity but never required major procedure or surgery. She has a personal history of allergic reaction to prednisone treatment with which she describes as linear pattern of rashes on her extremities and joint swelling.  Her family history significant for early age of arthritis but no known autoimmune disease.     Review of Systems  Constitutional:  Negative for fatigue.  HENT:  Negative for mouth sores, mouth dryness and nose dryness.   Eyes:  Negative for pain, itching and dryness.  Respiratory:  Negative for shortness of breath and difficulty breathing.   Cardiovascular:  Negative for chest pain and palpitations.  Gastrointestinal:  Negative for blood in stool, constipation and diarrhea.  Endocrine: Negative for increased urination.  Genitourinary:  Negative for difficulty urinating.  Musculoskeletal:  Negative for joint pain, joint pain, joint swelling, myalgias, morning stiffness, muscle tenderness and myalgias.  Skin:  Negative for color change, rash and redness.  Allergic/Immunologic: Negative for susceptible to infections.  Neurological:  Negative for dizziness, numbness, headaches, memory loss and weakness.  Hematological:  Negative for bruising/bleeding tendency.  Psychiatric/Behavioral:  Negative for confusion.    PMFS History:  Patient Active Problem List   Diagnosis Date Noted   Irritable bowel syndrome  (IBS) 04/25/2021   Anterior chest wall pain 04/25/2021   Chronic inflammatory arthritis 04/25/2021    Past Medical History:  Diagnosis Date   IBS (irritable bowel syndrome)     Family History  Problem Relation Age of Onset   Osteoarthritis Mother    Past Surgical History:  Procedure Laterality Date   RHINOPLASTY     ULNAR NERVE REPAIR     WISDOM TOOTH EXTRACTION     Social History   Social History Narrative   Not on file   Immunization History  Administered Date(s) Administered   PFIZER(Purple Top)SARS-COV-2 Vaccination 01/02/2020     Objective: Vital Signs: BP 109/64 (BP Location: Right Arm, Patient Position: Sitting, Cuff Size: Normal)   Pulse 68   Ht 5\' 6"  (1.676 m)   Wt 129 lb 9.6 oz (58.8 kg)   BMI 20.92 kg/m    Physical Exam Cardiovascular:     Rate and Rhythm: Normal rate and regular rhythm.  Pulmonary:     Effort: Pulmonary effort is normal.     Breath sounds: Normal breath sounds.  Musculoskeletal:     Right lower leg: No edema.     Left lower leg: No edema.  Skin:    General: Skin is warm and dry.     Findings: No rash.  Neurological:     Mental Status: She is alert.     Musculoskeletal Exam:  Palpable bony nodule at manubriosternal joint, mildly tender, mild tenderness to pressure on right sided costochondral joints without swelling Elbows full ROM no tenderness or swelling Wrists full ROM no tenderness or swelling Fingers full ROM no tenderness or swelling Ankles full ROM no tenderness or swelling   Investigation: No additional findings.  Imaging: No results found.  Recent Labs: Lab Results  Component Value Date   NA 139 06/19/2021   K 4.3 06/19/2021   CL 101 06/19/2021   CO2 29 06/19/2021   GLUCOSE 83 06/19/2021   BUN 14 06/19/2021   CREATININE 1.02 (H) 06/19/2021   BILITOT 1.4 (H) 06/19/2021   AST 21 06/19/2021   ALT 20 06/19/2021   PROT 8.1 06/19/2021   CALCIUM 10.3 (H) 06/19/2021   QFTBGOLDPLUS NEGATIVE 06/19/2021     Speciality Comments: No specialty comments available.  Procedures:  Local steroid injection performed 20 mg triamcinolone injected with 25 g needle to manubriosternal joint. Risks and benefits of procedure were discussed and alternative of observation on conservative treatment. Skin was prepared with iodine solution and time out called. Tolerated well without complications procedure completed approximately @1610 .  Allergies: Amoxicillin, Oseltamivir, Cefdinir, and Prednisone   Assessment / Plan:     Visit Diagnoses: Anterior chest wall pain  Recurrence of isolated manubriosternal joint arthritis with no new systemic complaints. I recommended we can repeat steroid injection due to reported improvement after last treatment. Follow up planned for next year. If symptoms continue to recur can discuss maintenance options such as SSZ.  Orders: No orders of the defined types were placed in this encounter.  No orders of the defined types were placed in this encounter.    Follow-Up Instructions: No follow-ups on file.   08/19/2021, MD  Note - This record has been created using .  Chart creation errors  have been sought, but may not always  have been located. Such creation errors do not reflect on  the standard of medical care.

## 2021-09-13 ENCOUNTER — Encounter: Payer: Self-pay | Admitting: Internal Medicine

## 2021-09-13 ENCOUNTER — Other Ambulatory Visit: Payer: Self-pay

## 2021-09-13 ENCOUNTER — Ambulatory Visit (INDEPENDENT_AMBULATORY_CARE_PROVIDER_SITE_OTHER): Payer: BLUE CROSS/BLUE SHIELD | Admitting: Internal Medicine

## 2021-09-13 VITALS — BP 109/64 | HR 68 | Ht 66.0 in | Wt 129.6 lb

## 2021-09-13 DIAGNOSIS — R0789 Other chest pain: Secondary | ICD-10-CM

## 2021-09-25 ENCOUNTER — Ambulatory Visit: Payer: BLUE CROSS/BLUE SHIELD | Attending: Student in an Organized Health Care Education/Training Program

## 2021-09-25 DIAGNOSIS — J302 Other seasonal allergic rhinitis: Secondary | ICD-10-CM

## 2021-09-25 DIAGNOSIS — K582 Mixed irritable bowel syndrome: Secondary | ICD-10-CM

## 2021-09-25 DIAGNOSIS — Z0000 Encounter for general adult medical examination without abnormal findings: Secondary | ICD-10-CM

## 2021-09-25 DIAGNOSIS — E785 Hyperlipidemia, unspecified: Secondary | ICD-10-CM

## 2021-09-25 MED ORDER — AZELASTINE HCL 0.1 % NA SOLN
1 | Freq: Two times a day (BID) | NASAL | 2 refills | Status: AC
Start: 2021-09-25 — End: ?

## 2021-09-25 NOTE — Progress Notes
Medical [facility]  Outpatient Note  PMD: Ramiro Harvest., MD  09/25/2021    SUBJECTIVE:  Chelsea Potter is a 20 y.o. female who presents for   Chief Complaint   Patient presents with   ? Medication Refill     azelastine       HPI:  Last encounter with PCP     Date & Time  09/25/2021 Provider  Ramiro Harvest., MD Department  [facility]        Patient Care Team:  Ramiro Harvest., MD as PCP - General (Family Medicine)    Patient is here for annual physical exam and follow up     Allergies: Amoxicillin, Oseltamivir, Cefdinir, and Prednisone  Medications:   Patient's last menstrual period was 06/22/2021 (approximate).  OB History   No obstetric history on file.     normal menses, no abnormal bleeding, pelvic pain or dischargeNo past medical history on file.  No past surgical history on file.  Immunization History   Administered Date(s) Administered   ? COVID-19, mRNA, (Pfizer - Purple Cap) 30 mcg/0.3 mL 01/02/2020, 09/21/2020   ? influenza, unspecified formulation 07/21/2020     Health Maintenance   Topic Date Due   ? Cervical Ca Screening: PAP Smear  Never done   ? COVID-19 Vaccine(Tracks primary and booster doses, not sup/immunocomp) (2 - Pfizer series) 10/12/2020   ? Influenza Vaccine (1) 06/08/2021   ? Tdap/Td Vaccine (1 - Tdap) 12/08/2029 (Originally 07/30/2018)   ? Hepatitis B Screening  Discontinued   ? HPV Vaccines  Discontinued   ? Hepatitis C Screening  Discontinued   ? Chlamydia/GC Screening  Discontinued   ? HIV Screening  Discontinued     No family history on file.  Social History     Social History Narrative   ? Not on file     Review of Systems -   A complete 14-system review of system was performed with pertinent positives and negatives included above and in the HPI and otherwise reviewed and found to be negative and/or non-contributory.    OBJECTIVE:  BP 118/77  ~ Pulse 74  ~ Temp 36.2 ?C (97.2 ?F) (Tympanic)  ~ Ht 5' 5.5'' (1.664 m) Comment: ! ~ Wt 127 lb (57.6 kg)  ~ LMP 06/22/2021 (Approximate)  ~ SpO2 98%  ~ BMI 20.81 kg/m?   General appearance - Alert, non-toxic, well appearing, and in no distress.    Eyes - Pupils are equally round and reactive to light, extra ocular movements are intact, conjunctiva are not injected.    Ears - External ear normal, canal normal with no debris, tympanic membrane translucent without erythema or deformity.    Nose - External nose normal in appearance.    Mouth - Mucous membranes moist, pharynx normal without lesions, uvula midline.    Respiratory - Clear to auscultation in anterior and posterior fields, no wheezes, rales or rhonchi, symmetric air movement.    Cardiovascular - Regular rate and rhythm, normal S1 and S2, no murmurs, rubs, clicks or gallops.    Abdomen - Bowel sounds present. Soft, nontender, nondistended, no masses or hepatosplenomegaly.    Back exam - No deformity of spine.     Musculoskeletal - Full range of motion in neck, shoulders, hips and knees.     Extremities - No peripheral edema. Warm and well perfused. No clubbing of digits.    Skin - Normal texture  turgor,  no rashes, no suspicious skin lesions noted.    Neurological - Alert, oriented. Strength and sensation in upper and lower extremities grossly normal.  Normal speech, normal gait.    Psychiatric - Appearance: well dressed, well groomed. Behavior: sits comfortably, answers questions, appropriate eye contact. Attitude: Cooperative. Level of Consciousness: awake and alert. Orientation: Deferred. Speech and Language: Normal rate, rhythm and prosody. Mood: euthymic. Affect: euthymic and consistent with mood. Thought Process/Form: Linear and goal-oriented. Thought Content: As per HPI.  Suicidality and Homicidality: No SI/HI. Insight and Judgment: Fair and appropriate.   Attention Span: Normal. Memory: No obvious deficits. Intellectual Functioning: Within normal limits.    Labs:  Results for orders placed or performed in visit on 08/29/20   CBC & Platelet Count   Result Value Ref Range    White Blood Cell Count ([facility]) 7.9 3.8 - 10.8 Thousand/uL    Red Blood Cell Count ([facility]) 4.85 3.80 - 5.10 Million/uL    Hemoglobin ([facility]) 14.1 11.7 - 15.5 g/dL    Hematocrit ([facility]) 16.1 35.0 - 45.0 %    MCV ([facility]) 88.7 80.0 - 100.0 fL    MCH ([facility]) 29.1 27.0 - 33.0 pg    MCHC ([facility]) 32.8 32.0 - 36.0 g/dL    RDW ([facility]) 09.6 04.5 - 15.0 %    Platelet Count ([facility]) 368 140 - 400 Thousand/uL    MPV ([facility]) 10.3 7.5 - 12.5 fL   Comprehensive Metabolic Panel   Result Value Ref Range    Glucose ([facility]) 93 65 - 99 mg/dL     Comment:               Fasting reference interval         Urea Nitrogen (BUN) ([facility]) 11 7 - 25 mg/dL    Creatinine ([facility]) 4.09 0.50 - 1.10 mg/dL    eGFR Non-Afr. American ([facility]) 94 > OR = 60 mL/min/1.80m2    eGFR African American ([facility]) 109 > OR = 60 mL/min/1.67m2    BUN/Creatinine Ratio ([facility]) NOT APPLICABLE 6 - 22 (calc)    Sodium ([facility]) 140 135 - 146 mmol/L    Potassium ([facility]) 4.3 3.5 - 5.3 mmol/L    Chloride ([facility]) 102 98 - 110 mmol/L    Carbon Dioxide ([facility]) 28 20 - 32 mmol/L    Calcium ([facility]) 10.3 (H) 8.6 - 10.2 mg/dL    Protein, Total ([facility]) 7.4 6.1 - 8.1 g/dL    Albumin ([facility]) 4.7 3.6 - 5.1 g/dL    Globulin ([facility]) 2.7 1.9 - 3.7 g/dL (calc)    Albumin/Globulin Ratio ([facility]) 1.7 1.0 - 2.5 (calc)    Bilirubin, Total ([facility]) 1.7 (H) 0.2 - 1.2 mg/dL    Alkaline Phosphatase ([facility]) 56 31 - 125 U/L    AST ([facility]) 16 10 - 30 U/L    ALT ([facility]) 15 6 - 29 U/L   Lipid Panel   Result Value Ref Range    Cholesterol, Total ([facility]) 217 (H) <200 mg/dL    HDL Cholesterol ([facility]) 68 > OR = 50 mg/dL    Triglycerides ([facility]) 65 <150 mg/dL    LDL-Cholesterol ([facility]) 133 (H) mg/dL (calc)     Comment: Reference range: <100     Desirable range <100 mg/dL for primary prevention;    <70 mg/dL for patients with CHD or diabetic patients   with > or = 2 CHD risk factors.     LDL-C is now calculated using the Martin-Hopkins   calculation, which is a validated novel method providing  better accuracy than the Friedewald equation in the   estimation of LDL-C.   Horald Pollen et al. Lenox Ahr. 1610;960(45): 2061-2068   (http://education.[facility].com/faq/FAQ164)      Chol/HDLC Ratio ([facility]) 3.2 <5.0 (calc)    Non-HDL Cholesterol ([facility]) 149 (H) <130 mg/dL (calc)     Comment: For patients with diabetes plus 1 major ASCVD risk   factor, treating to a non-HDL-C goal of <100 mg/dL   (LDL-C of <40 mg/dL) is considered a therapeutic   option.     Hgb A1c - HPLC   Result Value Ref Range    Hemoglobin A1c ([facility]) 5.1 <5.7 % of total Hgb     Comment: For the purpose of screening for the presence of  diabetes:     <5.7%       Consistent with the absence of diabetes  5.7-6.4%    Consistent with increased risk for diabetes              (prediabetes)  > or =6.5%  Consistent with diabetes     This assay result is consistent with a decreased risk  of diabetes.     Currently, no consensus exists regarding use of  hemoglobin A1c for diagnosis of diabetes in children.     According to American Diabetes Association (ADA)  guidelines, hemoglobin A1c <7.0% represents optimal  control in non-pregnant diabetic patients. Different  metrics may apply to specific patient populations.   Standards of Medical Care in Diabetes(ADA).         Free T3   Result Value Ref Range    T3, Free ([facility]) 3.5 2.3 - 4.2 pg/mL   TSH with reflex FT4, FT3   Result Value Ref Range    TSH W/Reflex To Ft4 ([facility]) 2.09 mIU/L     Comment:           Reference Range                         > or = 20 Years  0.40-4.50                              Pregnancy Ranges            First trimester    0.26-2.66            Second trimester   0.55-2.73            Third trimester    0.43-2.91       Imaging:  No results found.    ASSESSMENT and PLAN:  Chelsea Potter is a 20 y.o. female who presents for   Chief Complaint   Patient presents with   ? Medication Refill     azelastine       ICD-10-CM    1. Annual physical exam Z00.00 CBC     Comprehensive Metabolic Panel     Hgb A1c     Lipid Panel     TSH with reflex FT4, FT3     Vitamin D,25-Hydroxy      2. Hyperlipidemia, unspecified hyperlipidemia type  E78.5       3. Irritable bowel syndrome with both constipation and diarrhea  K58.2 Referral to Gastroenterology      4. Seasonal allergies  J30.2 azelastine 137 mcg/spray nasal spray        New or Modified Medications for this Encounter   New    No medications on file  Modified    Modified Medication Previous Medication    AZELASTINE 137 MCG/SPRAY NASAL SPRAY azelastine 137 mcg/spray nasal spray       Spray 1 spray in each nare two (2) times daily.           Associated Diagnoses: Seasonal allergies    Associated Diagnoses: --       Order Dose: 1 spray    Order Dose: --   Discontinued Medications    LACTOBACILLUS RHAMNOSUS, GG, CAPS CAPSULE    Take 1 capsule by mouth daily.       Associated Diagnoses: Other irritable bowel syndrome    Order Dose: 1 capsule     IBS:   Recommend continue with FODMAPs  Referral to GI provided     HLD:   Recommend Mediterranean diet and exercise  Consider Red Yeast Rice Supplements    Seasonal allergies:   Refill provided      Number and Complexity of Problems Addressed at the Encounter:   Level 4:   []   1 or more chronic illness with exacerbation, progression, or side effects of treatment  [x]   2 or more stable chronic illness  []   1 undiagnosed new problem with uncertain diagnosis  []   1 acute illness with systemic symptoms  []   1 acute complicated injury   Level 5:   []   1 or more chronic illness with severe exacerbation, progression, or side effects of treatment  []   1 acute or chronic illness or injury that poses a threat to life or bodily function     Review of Data: I have (select 1 out of 3 categories for level 4; 2 out of 3 categories for level 5)  []  Reviewed/ordered []  1 []  2 [x]  ? 3 unique laboratory, radiology, and/or diagnostic tests noted below   []  I have reviewed tests, documents or independent historian(s):  []  Reviewed/ordered ? 3 unique laboratory, radiology, and/or diagnostic tests noted    []  Reviewed prior external notes and incorporated into patient assessment as noted  []  I have independently interpreted test performed by other physician(s) as noted   []  Discussed management or test interpretation with external provider(s) as noted       Risk of Complication and or Morbidity or Mortality of Patient Management including Social Determinants of Health:   []   I deem the above diagnoses to have a risk of complication, morbidity or mortality of []  Minimal   [x]  Low (3)     []  Moderate (4)   []  Severe (5)  []  The diagnosis or treatment of said conditions is significantly limited by social determinants of health as noted.       48 min were spent on this patient visit included physician face-to-face time, preparing to see the patient, counseling and educating patient/family/caregiver, ordering medications/tests/procedures, referring and communicating with other healthcare professionals, documenting clinical information in the EHR and independently interpreting results and communicating results to patient/family/caregiver.      New patient: 99203 30-44 min, 16109 45-59 min, 60454 60-74 min  Established patient: 99213 20-29 min, 09811 30-39 min, 91478 40-55 min    The above plan/recommendation(s) were discussed with the patient. The patient had all questions answered satisfactorily and is in agreement with this recommended plan of care.    # Follow-up PRN if above symptoms do not resolve or worsen.      Patient Instructions      Understanding the Mediterranean Diet  A Mediterranean-style diet is a healthy way of  eating, not a specific diet to lose weight. It includes a lot of foods from plants such as vegetables, fruits, and whole grains, plus olive oil and seafood. It also includes dairy foods and meats, but in smaller amounts. And it includes a moderate amount of wine. Many studies over time have shown health benefits to eating this way. It focuses on making fresh food that?s full of flavor.  This plan of eating is inspired by how people eat in countries around the [employer] [place]. They include [place], [place], [place], [place], [place], and [place]. But it uses foods you can buy in almost any grocery store.  Health benefits of a Mediterranean diet  This diet is high in fiber, lean protein, and healthy oils. It?s low in saturated fats and sugar. The diet has been shown to help prevent or manage:  ? Depression  ? Diabetes  ? Heart disease  ? High blood pressure  ? Parkinson disease  ? Alzheimer disease  ? Cancers of the colon, prostate, and breast  What do I eat on a Mediterranean diet?  Plan each meal around vegetables and whole grains. Use olive oil. Add nuts and legumes. Include fish or lean protein. Foods to focus your meals around include:  Food type What to eat   Vegetables This includes leafy greens, tomatoes, squash, peppers, cucumbers, green beans, eggplant, avocados, potatoes, and olives. You can use fresh or frozen vegetables.   Fruits This includes apples, raspberries, strawberries, grapes, citrus fruit such as oranges and grapefruit, stone fruit such as apricots and peaches, plus figs, dates, and melon.   Whole grains This includes brown rice, whole oats, quinoa, millet, whole grain bread, whole-wheat pasta, and crackers made with whole grains.   Beans and legumes These include lentils, chickpeas, and beans such as pinto, fava, kidney, and black beans. Peanuts are also legumes.   Seafood This includes fish such as salmon, trout, mackerel, haddock, tuna, sardines, anchovies, and whitefish. It also includes shellfish such as shrimp, oysters, mussels, and clams.   Nuts and seeds These include walnuts, almonds, sunflower seeds, cashews, [place] nuts, and pecans.   Healthy oil Olive oil is the most common oil in the Mediterranean diet. But other healthy oils are canola, sunflower, safflower, and corn oils.   Herbs and spices Season food with oregano, pepper, sage, tarragon, thyme, basil, cinnamon, and cumin.   Wine Enjoy a glass of wine each day with a meal. Skip this if you need to not have alcohol for any reason.   Foods to eat in smaller amounts  You can add these foods in a few days a week, in smaller amounts:  ? Dairy foods, such as cheese, yogurt, and butter  ? Poultry, such as chicken and duck  ? Eggs  Occasional treats  Limit these foods in your weekly eating plan:  ? Red meats, such as beef, lamb, and pork  ? Refined grains, such as white rice and foods made with white flour  ? Sugary treats, such as chocolate, candy, or pastries  Adding lots of flavor  You can liven up fresh foods with many kinds of flavor. Try these sauces, dips, and seasonings:  ? Hummus  ? Marinara sauce  ? Salsa  ? Vinaigrette dressing  Tips for eating out  At restaurants:  ? Skip fried foods. These have a lot of saturated fat.  ? Look for fish dishes that are cooked without cream or butter.  ? Pick salads that have nuts and seeds.  ?  Choose vegetarian options that don?t have too much cheese.  StayWell last reviewed this educational content on 02/06/2019    ? 2000-2021 [employer], [place]. All rights reserved. This information is not intended as a substitute for professional medical care. Always follow your healthcare professional's instructions.     RED YEAST RICE Supplements       Signed,  Ramiro Harvest, MD   Clinical Instructor  Department of Medicine   9:30 AM 09/25/2021

## 2021-09-25 NOTE — Patient Instructions
Understanding the Mediterranean Diet  A Mediterranean-style diet is a healthy way of eating, not a specific diet to lose weight. It includes a lot of foods from plants such as vegetables, fruits, and whole grains, plus olive oil and seafood. It also includes dairy foods and meats, but in smaller amounts. And it includes a moderate amount of wine. Many studies over time have shown health benefits to eating this way. It focuses on making fresh food that?s full of flavor.  This plan of eating is inspired by how people eat in countries around the [employer]. They include [place], [place], [place], [place], [place], and [place]. But it uses foods you can buy in almost any grocery store.  Health benefits of a Mediterranean diet  This diet is high in fiber, lean protein, and healthy oils. It?s low in saturated fats and sugar. The diet has been shown to help prevent or manage:  Depression  Diabetes  Heart disease  High blood pressure  Parkinson disease  Alzheimer disease  Cancers of the colon, prostate, and breast  What do I eat on a Mediterranean diet?  Plan each meal around vegetables and whole grains. Use olive oil. Add nuts and legumes. Include fish or lean protein. Foods to focus your meals around include:  Food type What to eat   Vegetables This includes leafy greens, tomatoes, squash, peppers, cucumbers, green beans, eggplant, avocados, potatoes, and olives. You can use fresh or frozen vegetables.   Fruits This includes apples, raspberries, strawberries, grapes, citrus fruit such as oranges and grapefruit, stone fruit such as apricots and peaches, plus figs, dates, and melon.   Whole grains This includes brown rice, whole oats, quinoa, millet, whole grain bread, whole-wheat pasta, and crackers made with whole grains.   Beans and legumes These include lentils, chickpeas, and beans such as pinto, fava, kidney, and black beans. Peanuts are also legumes.   Seafood This includes fish such as salmon, trout, mackerel, haddock, tuna, sardines, anchovies, and whitefish. It also includes shellfish such as shrimp, oysters, mussels, and clams.   Nuts and seeds These include walnuts, almonds, sunflower seeds, cashews, [place] nuts, and pecans.   Healthy oil Olive oil is the most common oil in the Mediterranean diet. But other healthy oils are canola, sunflower, safflower, and corn oils.   Herbs and spices Season food with oregano, pepper, sage, tarragon, thyme, basil, cinnamon, and cumin.   Wine Enjoy a glass of wine each day with a meal. Skip this if you need to not have alcohol for any reason.   Foods to eat in smaller amounts  You can add these foods in a few days a week, in smaller amounts:  Dairy foods, such as cheese, yogurt, and butter  Poultry, such as chicken and duck  Eggs  Occasional treats  Limit these foods in your weekly eating plan:  Red meats, such as beef, lamb, and pork  Refined grains, such as white rice and foods made with white flour  Sugary treats, such as chocolate, candy, or pastries  Adding lots of flavor  You can liven up fresh foods with many kinds of flavor. Try these sauces, dips, and seasonings:  Hummus  Marinara sauce  Salsa  Vinaigrette dressing  Tips for eating out  At restaurants:  Skip fried foods. These have a lot of saturated fat.  Look for fish dishes that are cooked without cream or butter.  Pick salads that have nuts and seeds.  Choose vegetarian options that don?t have too much  cheese.  StayWell last reviewed this educational content on 02/06/2019    ? 2000-2021 [employer], [place]. All rights reserved. This information is not intended as a substitute for professional medical care. Always follow your healthcare professional's instructions.     RED YEAST RICE Supplements

## 2021-11-03 ENCOUNTER — Telehealth: Payer: BLUE CROSS/BLUE SHIELD

## 2021-11-03 NOTE — Telephone Encounter
S/W pt advised to come into [facility] for rx and eval

## 2021-11-03 NOTE — Telephone Encounter
Message to Practice/Provider      Message: Pt states she has a sinus infection and is requesting antibiotics Rx be sent to pharm.       Return call is not being requested by the patient or caller.    Patient or caller has been notified of the turnaround time of 1-2 business day(s).

## 2021-11-05 ENCOUNTER — Encounter: Payer: Self-pay | Admitting: Emergency Medicine

## 2021-11-05 ENCOUNTER — Ambulatory Visit
Admission: EM | Admit: 2021-11-05 | Discharge: 2021-11-05 | Disposition: A | Discharge status: Home / Self Care | Payer: BLUE CROSS/BLUE SHIELD | Attending: Emergency Medicine | Admitting: Emergency Medicine

## 2021-11-05 DIAGNOSIS — J0100 Acute maxillary sinusitis, unspecified: Secondary | ICD-10-CM

## 2021-11-05 MED ORDER — DOXYCYCLINE HYCLATE 100 MG PO CAPS: 100.0000 mg | ORAL_CAPSULE | Freq: Two times a day (BID) | ORAL | 0 refills | Status: AC

## 2021-11-05 NOTE — ED Provider Notes (Signed)
Renaldo Fiddler    CSN: [id number] Arrival date & time: 11/05/21  1300      History   Chief Complaint Chief Complaint  Patient presents with   Nasal Congestion   Sore Throat   Headache    HPI Latasha Peterson is a 21 y.o. female.  Patient presents with >1 week history of sinus congestion, sinus pressure, postnasal drip, sore throat, cough.  No fever, rash, shortness of breath, vomiting, diarrhea, or other symptoms.  Several OTC cold and sinus medications taken without relief.  She returned to [place] yesterday from 3 week trip to [place].     The history is provided by the patient.   Past Medical History:  Diagnosis Date   IBS (irritable bowel syndrome)     Patient Active Problem List   Diagnosis Date Noted   Irritable bowel syndrome (IBS) 04/25/2021   Anterior chest wall pain 04/25/2021   Chronic inflammatory arthritis 04/25/2021    Past Surgical History:  Procedure Laterality Date   RHINOPLASTY     ULNAR NERVE REPAIR     WISDOM TOOTH EXTRACTION      OB History   No obstetric history on file.      Home Medications    Prior to Admission medications   Medication Sig Start Date End Date Taking? Authorizing Provider  doxycycline (VIBRAMYCIN) 100 MG capsule Take 1 capsule (100 mg total) by mouth 2 (two) times daily for 10 days. 11/05/21 11/15/21 Yes Mickie Bail, NP  azelastine (ASTELIN) 0.1 % nasal spray  05/10/20   [provider]  EPINEPHrine (EPIPEN JR) 0.15 MG/0.3ML injection Inject into the muscle. 08/29/20   [provider]  loratadine (CLARITIN) 10 MG tablet Take by mouth.    [provider]    Family History Family History  Problem Relation Age of Onset   Osteoarthritis Mother     Social History Social History   Tobacco Use   Smoking status: Never   Smokeless tobacco: Never  Vaping Use   Vaping Use: Never used  Substance Use Topics   Alcohol use: Yes    Alcohol/week: 7.0 standard drinks    Types: 7 Glasses of  wine per week   Drug use: Never     Allergies   Amoxicillin, Oseltamivir, Cefdinir, and Prednisone   Review of Systems Review of Systems  Constitutional:  Negative for chills and fever.  HENT:  Positive for congestion, postnasal drip, rhinorrhea, sinus pressure and sore throat. Negative for ear pain.   Respiratory:  Positive for cough. Negative for shortness of breath.   Cardiovascular:  Negative for chest pain and palpitations.  Gastrointestinal:  Negative for diarrhea and vomiting.  Skin:  Negative for color change and rash.  All other systems reviewed and are negative.   Physical Exam Triage Vital Signs ED Triage Vitals [11/05/21 1416]  Enc Vitals Group     BP 140/73     Pulse Rate 76     Resp 18     Temp 98.1 F (36.7 C)     Temp Source Oral     SpO2 97 %     Weight      Height      Head Circumference      Peak Flow      Pain Score      Pain Loc      Pain Edu?      Excl. in GC?    No data found.  Updated Vital Signs BP 140/73 (  BP Location: Left Arm)    Pulse 76    Temp 98.1 F (36.7 C) (Oral)    Resp 18    SpO2 97%   Visual Acuity Right Eye Distance:   Left Eye Distance:   Bilateral Distance:    Right Eye Near:   Left Eye Near:    Bilateral Near:     Physical Exam Vitals and nursing note reviewed.  Constitutional:      General: She is not in acute distress.    Appearance: She is well-developed.  HENT:     Right Ear: Tympanic membrane normal.     Left Ear: Tympanic membrane normal.     Nose: Congestion present.     Mouth/Throat:     Mouth: Mucous membranes are moist.     Pharynx: Oropharynx is clear.  Cardiovascular:     Rate and Rhythm: Normal rate and regular rhythm.     Heart sounds: Normal heart sounds.  Pulmonary:     Effort: Pulmonary effort is normal. No respiratory distress.     Breath sounds: Normal breath sounds.  Musculoskeletal:     Cervical back: Neck supple.  Skin:    General: Skin is warm and dry.  Neurological:      Mental Status: She is alert.  Psychiatric:        Mood and Affect: Mood normal.        Behavior: Behavior normal.     UC Treatments / Results  Labs (all labs ordered are listed, but only abnormal results are displayed) Labs Reviewed - No data to display  EKG   Radiology No results found.  Procedures Procedures (including critical care time)  Medications Ordered in UC Medications - No data to display  Initial Impression / Assessment and Plan / UC Course  I have reviewed the triage vital signs and the nursing notes.  Pertinent labs & imaging results that were available during my care of the patient were reviewed by me and considered in my medical decision making (see chart for details).    Acute sinusitis.  Patient declines COVID test today.  She has been symptomatic for >1 week.  Treating with doxycycline.  Discussed symptomatic care.  Instructed patient to follow up with her PCP if her symptoms are not improving.  She agrees to plan of care.    Final Clinical Impressions(s) / UC Diagnoses   Final diagnoses:  Acute non-recurrent maxillary sinusitis     Discharge Instructions      Take the doxycycline as directed.  Follow up with your primary care provider if your symptoms are not improving.         ED Prescriptions     Medication Sig Dispense Auth. Provider   doxycycline (VIBRAMYCIN) 100 MG capsule Take 1 capsule (100 mg total) by mouth 2 (two) times daily for 10 days. 20 capsule Mickie Bail, NP      PDMP not reviewed this encounter.   Mickie Bail, NP 11/05/21 1439

## 2021-11-05 NOTE — ED Triage Notes (Signed)
Pt here with congestion, sore throat and sinus pressure x 1 week. No fever.

## 2021-11-05 NOTE — Discharge Instructions (Addendum)
Take the doxycycline as directed.    Follow up with your primary care provider if your symptoms are not improving.    

## 2021-12-20 ENCOUNTER — Ambulatory Visit: Payer: BLUE CROSS/BLUE SHIELD | Attending: Gastroenterology

## 2021-12-20 DIAGNOSIS — R197 Diarrhea, unspecified: Secondary | ICD-10-CM

## 2021-12-20 NOTE — Progress Notes
Gastroenterology Consult   ATTENDING: Vernie Shanks   PATIENT: Chelsea Potter  MRN: [id number]  DOB: 2001-02-18  DATE OF SERVICE: 12/20/2021  REFERRING PROVIDER: No ref. provider found  PRIMARY CARE PROVIDER: Ramiro Harvest., MD    REASON FOR REFERRAL:   Chief Complaint   Patient presents with   ? GI Problem     Pt experiencing intermittent diarrhea, bloating and slight nausea (after eating) approx 2-3 yrs.  Pt denies other symptoms.        Subjective:     Chief Complaint:  Chelsea Potter is a 21 y.o. female who presents for chronic digestive issues. Progressive worsening over time and seems to worse since going to school. Symptoms of diarrhea, post meal nausea, and worsening symptoms following antibiotic therapy. Given a trial of FODMAP diet with partial improvement in symptoms. Frequent defecation.  Multiple bowel movements daily.  Maybe worse with gluten. Working out a lot. Currently on spring break, graduating from college this year. GF with colitis.               Past Medical & Surgical History:  PMH  Costochondritis    PSH  Sinus surgery    Relevant Family History:  (yes if checked)  []   Family history of colorectal cancer    []   Family history of GI cancer (not CRC)   []   Family history of IBD   []   Family history of celiac disease    Relevant Social History:    Social History     Tobacco Use   ? Smoking status: Never   ? Smokeless tobacco: Never   Substance Use Topics   ? Alcohol use: Yes     Alcohol/week: 1.8 oz     Types: 3 Glasses of wine per week   ? Drug use: Never             MEDICATIONS:     Medications that the patient states to be currently taking   Medication Sig   ? azelastine 137 mcg/spray nasal spray Spray 1 spray in each nare two (2) times daily.   ? EPINEPHrine (EPIPEN JR) 0.15 mg/0.3 mL injection Inject 0.3 mLs (0.15 mg total) into the muscle as needed for for Anaphylaxis.   ? Loratadine (CLARITIN PO) Take by mouth.         Allergies :    is allergic to amoxicillin, oseltamivir, cefdinir, and prednisone.    PHYSICAL EXAM      Last Recorded Vital Signs:    12/20/21 0934   BP: 104/67   Pulse: 80   Resp: 16   SpO2: 98%      Body mass index is 21.27 kg/m?Marland Kitchen    System Check if examined and normal Positive or additional negative findings   Constit  [x]  General appearance - normal     Eyes  []  Conjuctivae clear       HENMT  []  Normal Lips/teeth/gums, oropharynx, head     Neck  []  Inspection/palpation; thyroid normal      Resp  []  Effort good. Clear to auscultation bilaterally.     CV  [x]  Normal Rhythm/rate without murmur     Abdomen  [x]  Abdomen soft and NT, no rebound/guarding   [x]  Active BS 4 quadrants   []  No appreciable flank or shifting dullness   []  No hepatosplenomegaly   []  No umbilical hernia    Rectal   []  No external hemorrhoids   []  No masses palpated on DRE   []   Brown stool in vault   []   Appropriate resting and squeeze pressure   []   Appropriate bear down maneuver      MSK  []  Grossly normal strength, ROM     Neuro   []  Grossly normal      Psychiatric  []   Oriented to time, place and person   []   Appropriate insight/judgement & mood/affect                Lab Review:  Lab Results   Component Value Date    WBC 6.26 09/25/2021    HGB 14.1 09/25/2021    HCT 43.5 09/25/2021    MCV 94.2 09/25/2021    PLT 275 09/25/2021     Lab Results   Component Value Date    CREAT 0.78 09/25/2021    BUN 13 09/25/2021    NA 137 09/25/2021    K 4.2 09/25/2021    CL 100 09/25/2021    CO2 26 09/25/2021    ALT 16 09/25/2021    AST 22 09/25/2021    ALKPHOS 60 09/25/2021    BILITOT 1.1 09/25/2021    ALBUMIN 4.8 09/25/2021            Medical Decision Making addressed on 12/20/2021:      Number and Complexity of Problems Addressed at the Encounter:   []   1 or more chronic illness with exacerbation, progression, or side effects of treatment  []   2 or more stable chronic illness  [x]   1 undiagnosed new problem with uncertain diagnosis  []   1 acute illness with systemic symptoms  []   1 acute complicated injury     Review of Data: I have  [x]  Reviewed/ordered ? 3 unique laboratory, radiology, and/or diagnostic tests noted    []  Reviewed prior external notes and incorporated into patient assessment as noted  []  I have independently interpreted test performed by other physician(s) as noted   []  Discussed management or test interpretation with external provider(s) as noted       Risk of Complication and or Morbidity or Mortality of Patient Management including Social Determinants of Health:   []   I deem the above diagnoses to have a risk of complication, morbidity or mortality of Moderate   []  The diagnosis or treatment of said conditions is significantly limited by social determinants of health as noted.     Visit Diagnoses  / Problems addressed on 12/20/2021:     Encounter Diagnosis   Name Primary?   ? Diarrhea, unspecified type Yes          Assessment & Plan :     Chronic diarrhea not clearly exacerbated by food or stress. Probable IBS-D but consider IBD, Celiac disease, EPI, and SIBO. Suggest stool testing but would favor a diagnostic colonoscopy on return from school. Could consider TCA trial of trial of xifaxan pending results  - Celiac Ab Group; Future  - Calprotectin, Fecal by Immunoassay; Future  - Pancreatic Elastase, Fecal by Immunoassay; Future  - Parasite Enteric Pathogen Panel; Future  - Fecal Immunochemical Test; Future  - Carotene; Future  - GI Endoscopy Procedures               The above recommendation were discussed with the patient. The patient has all questions answered satisfactorily and is in agreement with this recommended plan of care.    Vernie Shanks, MD   12/20/2021 1:14 PM

## 2021-12-27 ENCOUNTER — Ambulatory Visit: Payer: BLUE CROSS/BLUE SHIELD

## 2021-12-27 ENCOUNTER — Inpatient Hospital Stay: Payer: BLUE CROSS/BLUE SHIELD | Attending: Gastroenterology

## 2021-12-27 ENCOUNTER — Telehealth: Payer: BLUE CROSS/BLUE SHIELD

## 2021-12-27 MED ORDER — NA SULFATE-K SULFATE-MG SULF 17.5-3.13-1.6 GM/177ML PO SOLN
0 refills | Status: AC
Start: 2021-12-27 — End: ?

## 2021-12-27 NOTE — Telephone Encounter
MPU Procedure Checklist:     Prep instructions have been delivered to patient via:  [] Email to __________  [] Fax to ____________  [] Mail to Address on file  [x] MyChart  [] Preps provided by the office    [] Open access- Pharmacy on file has been confirmed and bowel prep medication has been pended.    Does procedure order match the case scheduled:  [x] Yes  [] No  [] Other:  Comments:    Encounter sent to FCU team if the following applies:  [] Procedure scheduled on:  [] Changes have been made to current procedure scheduled.     Sedation Method:  [x] Patient has been advised of method of anesthesia that will be used in this procedure. Pt has been offered all options and is requesting to be scheduled with MAC. Patient will pay $200 if authorization is denied.    [] Pt has been chosen to schedule procedure with conscious sedation at an ASC.

## 2022-01-19 ENCOUNTER — Ambulatory Visit: Payer: BLUE CROSS/BLUE SHIELD | Admitting: Internal Medicine

## 2022-02-28 MED ORDER — AZELASTINE HCL 0.1 % NA SOLN
1 | Freq: Two times a day (BID) | NASAL | 2 refills
Start: 2022-02-28 — End: ?

## 2022-03-06 MED ORDER — AZELASTINE HCL 0.1 % NA SOLN
1 | Freq: Two times a day (BID) | NASAL | 2 refills | Status: AC
Start: 2022-03-06 — End: ?

## 2022-03-16 ENCOUNTER — Telehealth: Payer: BLUE CROSS/BLUE SHIELD

## 2022-03-16 NOTE — Telephone Encounter
Confirmation Documentation   Patient is scheduled on (DATE) for: 03/23/22 at 7:30 AM   _0  Colonoscopy   _1  Upper Endoscopy   _2  Pouchoscopy   _3  Upper Endoscopy w/Bravo   _4  Upper Endoscopy w/ Esophageal Manometry   _5  Upper Endoscopy w/ Esophageal Manometry & pH   _6  Upper Endoscopy w/Endoflip   _7  Sigmoidoscopy   _8  Anoscopy   _9  Illeoscopy   _10  Small Bowel Enteroscopy   _11  Esophageal Manometry   _12  Anorectal Manometry   _13  pH Study   _14  Capsule Endoscopy    Informed patient of the following:   _15  Arrival time   _16  Location including suite number   _17  Transportation   _18  [employer]   _19  Is procedure authorized?   _20  Does patient have prep instructions?   _21  Informed patient to call back should there be any questions?   _22  Does procedure order match the case scheduled    NOTE: If patients have any questions regarding prescribed medication patient needs to contact their prescribing physician to ensure it is ok to stop medications.

## 2022-03-20 ENCOUNTER — Ambulatory Visit: Payer: BLUE CROSS/BLUE SHIELD

## 2022-03-20 ENCOUNTER — Telehealth: Payer: BLUE CROSS/BLUE SHIELD

## 2022-03-20 NOTE — Telephone Encounter
LM for patient to remind and confirm procedure scheduled for 03/23/2022  Check-in time:6:30 am  Procedure time:7:30 am  Advised patient to take any blood pressure, heart, anti-seizurals and inhaler medications that morning of the procedure. Patient advised if using an inhaler to bring to the procedure. Advised patient they must have a ride home from the procedure with someone that can accompany them home. Medical procedure staff must be able to confirm ride when they arrive or the procedure will be cancelled.    Advised to call back with any questions or concerns.

## 2022-03-23 ENCOUNTER — Telehealth: Payer: BLUE CROSS/BLUE SHIELD

## 2022-03-23 MED ADMIN — SODIUM CHLORIDE 0.9 % IV SOLN: INTRAVENOUS | @ 15:00:00 | Stop: 2022-03-23 | NDC 00338004904

## 2022-03-23 MED ADMIN — PROPOFOL 200 MG/20ML IV EMUL: INTRAVENOUS | @ 15:00:00 | Stop: 2022-03-23 | NDC 63323026929

## 2022-03-23 NOTE — Procedures
PATIENT NAME:  Chelsea Potter  DATE OF BIRTH:   04-08-2001  RECORD NUMBER:  [id number]  DATE/TIME OF PROCEDURE: 03/23/2022 / 07:30:00 AM  ENDOSCOPIST:  Vernie Shanks, MD  REFERRING PHYSICIAN:     FELLOW:       INDICATIONS FOR EXAMINATION:  Chronic diarrhea               PROCEDURE PERFORMED:   COLONOSCOPY - cold biopsy    MEDICATIONS:   MAC Anesthesia    PROCEDURE TECHNIQUE:   Patient's medications, allergies, past medical, surgical, social and family histories were reviewed and updated as appropriate. A discussion of informed consent was had with the patient and/or the patient's family prior to the   procedure, including sedation.  The alternatives, benefits and risks of  the procedure including but not limited to perforation, hemorrhage, infection, adverse drug reaction and aspiration were discussed    Description of Procedure:  After discussion of informed consent, and appropriate level of sedation were attained, the patient was placed in the left lateral position.  The patient was monitored continuously with pulse oximetry, blood pressure monitoring, and direct observations.      After digital rectal examination, the colonoscope PCF-HQ190L #[id number] (PEDS) was inserted into the rectum and advanced under direct vision to the level of the terminal ileum.  The quality of the colonic preparation was Poor.  A careful inspection was   made as the colonoscope was withdrawn.  Findings and interventions are described below.    EXTENT OF EXAM:  terminal ileum              INSTRUMENTS:   PCF-HQ190L #[id number] (PEDS)  TECHNICAL DIFFICULTY:  No   LIMITATIONS:  None  TOLERANCE: Good  VISUALIZATION:  Good    FINDINGS:   The very distal terminal ileum had evidence of mild erythema the more proximal terminal ileum beyond 1-2 cm was normal in appearance to 10cm.  Forceps biopsies were obtained. The ileocecal valve itself was erythematous and with small aphthous   ulcerations. Forceps biopsies obtained. The preparation of the colon itself was poor with adherent stool coating the majority of the right and transverse colon. The visible mucosa was normal in appearance. Biopsies obtained from the right and left   colon. Normal rectum biopsied using forceps.    ESTIMATED BLOOD LOSS:   None     DIAGNOSIS:  Mild inflammatory changes at the ileocecal valve. Differential diagnosis includes medications, infection, and chronic inflammatory conditions e.g. Crohn's.  Poor preparation of the colon, but otherwise normal mucosa.   Consider overflow diarrhea given chronic diarrhea and preparation quality.    RECOMMENDATIONS:  Await pathology results  Office followup with me to be scheduled    CPT CODE:  84696 Colonoscopy, flexible; with biopsy, single or multiple          This electronic signature authenticates all electronic and/or handwritten documentation, including orders, generated by the signer during the episode of care contained in this record.  03/23/2022 07:58:44 AM By Vernie Shanks

## 2022-03-23 NOTE — Telephone Encounter
Appointment Accommodation Request      Appointment Type: Procedure Follow up    Reason for sooner request: procedure 03/23/22, pt scheduled 06/13/22    Date/Time Requested (If any):  any    Last seen by MD: 03/23/22    Any Symptoms:  []  Yes  [x]  No       If yes, what symptoms are you experiencing:   o Duration of symptoms (how long):     Patient or caller was offered an appointment but declined.    Patient or caller was advised to seek emergency services if conditions are urgent or emergent.    Patient or caller has been notified of the turnaround time of 1-2 business (days).

## 2022-03-23 NOTE — Telephone Encounter
She has a couple trips coming up so she can be overbooked when she returns either July or august.

## 2022-03-23 NOTE — H&P
Indication for Procedure: Chronic diarrhea    Level of sedation intended for procedure: moderate, deep, MAC  []  Moderate []  Deep [x]  MAC []  Anesthesia   Prior sedation problems []  Yes [x]  No  If yes explain:  Teeth: [x]  Intact []  Loose []  Missing []  Dentures []  Bridges  [place] Visualized: [x]  Yes []  Partially []  not at all  Neck ROM: [x]   Normal []  Limited  Past Medical History:  History reviewed. No pertinent past medical history.    Past Surgical History:  History reviewed. No pertinent surgical history.    Family History:  History reviewed. No pertinent family history.     Social History:  Social History     Socioeconomic History   ? Marital status: Single   Tobacco Use   ? Smoking status: Never   ? Smokeless tobacco: Never   Substance and Sexual Activity   ? Alcohol use: Yes     Alcohol/week: 1.8 oz     Types: 3 Glasses of wine per week   ? Drug use: Never       Allergies:  Allergies   Allergen Reactions   ? Amoxicillin Hives and Itching   ? Oseltamivir    ? Cefdinir Hives, Itching and Rash   ? Prednisone Hives, Itching and Rash       Current Medications:  No current facility-administered medications for this encounter.     ROS:  Constitutional: The patient denies any fever or chills.  HEENT: no symptoms  Skin: Patient denies bruising, rashes or itching.  Cardiovascular: Patient denies any chest pains.  There is no palpitations.  Respiratory: Patient denies any shortness of breath.  There is no cough or congestion.    Gastrointestinal: as in HPI and chief complaints  Genitourinary: Patient denies any symptoms of frequency urination or burning urination.  Neurological: Patient denies any lightheadedness, dizziness or loss of consciousness.  Psychiatric: Patient denies any symptoms of anxiety, depression or agitation.  Musculoskeletal: Patient denies any ankle swelling.      Physical Exam:    Vitals Signs: BP 116/74  ~ Pulse 72  ~ Temp 36.6 ?C (97.9 ?F) (Temporal)  ~ Resp 15  ~ Ht 5' 5'' (1.651 m)  ~ Wt 125 lb (56.7 kg)  ~ LMP 03/12/2022 (Approximate)  ~ SpO2 98%  ~ BMI 20.80 kg/m?   Constitutional: Patient appears very comfortable.  HENT: Normal exam  HEENT: normal exam  Cardiovascular: Heart sounds are normal.  There no murmurs or gallops.  There is no irregular heartbeat.  Respiratory: Patient is not short of breath.  There is no wheezing or rales.  Lungs are clear to auscultation.  Gastrointestinal: Abdomen is soft and nontender.  Bowel sounds are present.   No masses noted.   Genitourinary: Unremarkable examination.  Musculoskeletal: There is no edema, cyanosis or clubbing.    Integumentry: There is no bruising.  No rashes are noted.  Neurological: Patient is awake alert and fully oriented. Psychiatric: Patient is not depressed, apprehensive or agitated.      Assessment:  Chelsea Potter is a 21 y.o.female who is brought to the endoscopy suite for the procedure because of the following:    Chronic diarrhea        The procedure and its possible complications including but not limited to bleeding and perforation and their treatment including but not limited to blood transfusion and even surgery was once again explained to, and understood by the patient. Patient agrees to proceed with the procedure.  ASA Classification:  2    I have reviewed the history and physical and have determined Chelsea Potter to be an appropriate candidate and medically stable to undergo the procedure with planned sedation and analgesia.    Patient cleared for discharge once discharge criteria is met

## 2022-03-23 NOTE — Telephone Encounter
Please advice if we need to schedule a follow up, if so can you accommodate.       Thank you,  Administrative Assistant II   [place]

## 2022-03-23 NOTE — Discharge Instructions
PROCEDURE: [] Upper endoscopy (EGD)   [x] Colonoscopy    [] Sigmoidoscopy      ACTIVITY:    [x] Avoid any strenuous physical activity for 24 hours    [x] No driving or operating heavy machinery for 24 hours    [x] No alcoholic beverages for 24 hours (may interact with some medications you received during your procedure)   [x] May use a heating pad on low temperature for any abdominal cramping.      DIET:    [x] Resume your usual diet    [] Specific diet instructions:     Call Dr. Hashemi's office [phone] if you experience any of the following:      [x] Severe chest pain, difficulty breathing    [x] Passage of blood clots, black tarry stools, vomiting blood    [x] Severe inflammation at the I.V. site    [x] Difficulty swallowing or persistent vomiting    [x] Abdominal pain    [x] Chills or fever greater than 101 F or 38.4 C within 24 hours of procedure    FOLLOW-UP CARE:        [x]  Call Dr. Hashemi [phone] for appointment as needed   []  Call  Dr. Hashemi [phone] for appointment in one month   []  Call your primary MD for appointment as needed   []  Call/Message Dr. Hashemi [phone] for biopsy results in one week   [x] No  NSAIDs for the next 7 days.   [x] Ok to resume all home medications.

## 2022-03-27 LAB — Tissue Exam

## 2022-03-27 NOTE — Telephone Encounter
Patient is scheduled for July. ok to double book per md.

## 2022-04-02 ENCOUNTER — Ambulatory Visit: Payer: BLUE CROSS/BLUE SHIELD

## 2022-04-25 NOTE — Progress Notes
Gastroenterology Consult   ATTENDING: Vernie Shanks   PATIENT: Chelsea Potter  MRN: [id number]  DOB: November 01, 2000  DATE OF SERVICE: 04/26/2022  REFERRING PROVIDER: No ref. provider found  PRIMARY CARE PROVIDER: Ramiro Harvest., MD    REASON FOR REFERRAL:   Chief Complaint   Patient presents with   ? Follow-up        Subjective:     Chief Complaint:  Chelsea Potter is a 21 y.o. female who presents for chronic digestive issues.  Ongoing diarrhea for the last 10 days.  Colonoscopy notable for poor preparation.  No change in symptoms following.  Ongoing diarrhea.  Baseline BM frequent 5-10 times per day.        Prior History   Progressive worsening over time and seems to worse since going to school. Symptoms of diarrhea, post meal nausea, and worsening symptoms following antibiotic therapy. Given a trial of FODMAP diet with partial improvement in symptoms. Frequent defecation.  Multiple bowel movements daily.  Maybe worse with gluten. Working out a lot. Currently on spring break, graduating from college this year. GF with colitis.               Past Medical & Surgical History:  PMH  Costochondritis    PSH  Sinus surgery    Relevant Family History:  (yes if checked)  []   Family history of colorectal cancer    []   Family history of GI cancer (not CRC)   []   Family history of IBD   []   Family history of celiac disease    Relevant Social History:    Social History     Tobacco Use   ? Smoking status: Never   ? Smokeless tobacco: Never   Substance Use Topics   ? Alcohol use: Yes     Alcohol/week: 1.8 oz     Types: 3 Glasses of wine per week   ? Drug use: Never             MEDICATIONS:     Medications that the patient states to be currently taking   Medication Sig   ? azelastine 137 mcg/spray nasal spray Spray 1 spray in each nare two (2) times daily.   ? EPINEPHrine (EPIPEN JR) 0.15 mg/0.3 mL injection Inject 0.3 mLs (0.15 mg total) into the muscle as needed for for Anaphylaxis.   ? Loratadine (CLARITIN PO) Take by mouth. Allergies :    is allergic to amoxicillin, oseltamivir, cefdinir, and prednisone.    PHYSICAL EXAM      Last Recorded Vital Signs:    04/26/22 1341   BP: 126/74   Pulse: 94   SpO2: 97%      Body mass index is 19.95 kg/m?Marland Kitchen    System Check if examined and normal Positive or additional negative findings   Constit  [x]  General appearance - normal     Eyes  []  Conjuctivae clear       HENMT  []  Normal Lips/teeth/gums, oropharynx, head     Neck  []  Inspection/palpation; thyroid normal      Resp  []  Effort good. Clear to auscultation bilaterally.     CV  [x]  Normal Rhythm/rate without murmur     Abdomen  [x]  Abdomen soft and NT, no rebound/guarding   [x]  Active BS 4 quadrants   []  No appreciable flank or shifting dullness   []  No hepatosplenomegaly   []  No umbilical hernia    Rectal   []  No external hemorrhoids   []   No masses palpated on DRE   []  Brown stool in vault   []   Appropriate resting and squeeze pressure   []   Appropriate bear down maneuver      MSK  []  Grossly normal strength, ROM     Neuro   []  Grossly normal      Psychiatric  []   Oriented to time, place and person   []   Appropriate insight/judgement & mood/affect                Lab Review:  Lab Results   Component Value Date    WBC 6.26 09/25/2021    HGB 14.1 09/25/2021    HCT 43.5 09/25/2021    MCV 94.2 09/25/2021    PLT 275 09/25/2021     Lab Results   Component Value Date    CREAT 0.78 09/25/2021    BUN 13 09/25/2021    NA 137 09/25/2021    K 4.2 09/25/2021    CL 100 09/25/2021    CO2 26 09/25/2021    ALT 16 09/25/2021    AST 22 09/25/2021    ALKPHOS 60 09/25/2021    BILITOT 1.1 09/25/2021    ALBUMIN 4.8 09/25/2021     03/23/22 Colonsocopy  ?  FINDINGS:   The very distal terminal ileum had evidence of mild erythema the more proximal terminal ileum beyond 1-2 cm was normal in appearance to 10cm.  Forceps biopsies were obtained. The ileocecal valve itself was erythematous and with small aphthous   ulcerations. Forceps biopsies obtained. The preparation of the colon itself was poor with adherent stool coating the majority of the right and transverse colon. The visible mucosa was normal in appearance. Biopsies obtained from the right and left   colon. Normal rectum biopsied using forceps.  ?  A. SMALL INTESTINE, TERMINAL ILEUM (BIOPSY):  - Prominent eosinophils (>100 in a concentrated high power field)   ?  B. ILEOCECAL VALVE (BIOPSY):  - Focal active inflammation  - Prominent eosinophils (>100 in a concentrated high power field)   ?  C. LARGE INTESTINE, RANDOM RIGHT COLON (BIOPSY):  - No histopathologic abnormality  - No features of colitis  ?  D. LARGE INTESTINE, RANDOM LEFT COLON (BIOPSY):  - No histopathologic abnormality  - No features of colitis  ?  E. LARGE INTESTINE, RECTUM (BIOPSY):  - No histopathologic abnormality  - No features of proctitis  ?  COMMENT: Lamina propria eosinophils are prominent in the biopsies from the terminal ileum and ileocecal valve, but do not appear increased in the biopsies from the colon and rectum. Focal active inflammation is also noted in the biopsy from the ileocecal valve with a few foci of cryptitis and a few crypt abscesses. No granuloma, pyloric metaplasia or infectious organisms are identified. The findings raise the possibility of eosinophilic enteritis in the appropriate clinical setting, and drug reaction needs to be ruled out. While Crohn disease is in the differential, the findings are insufficient for the diagnosis.      Medical Decision Making addressed on 04/26/2022:      Number and Complexity of Problems Addressed at the Encounter:   [x]   1 or more chronic illness with exacerbation, progression, or side effects of treatment  []   2 or more stable chronic illness  [x]   1 undiagnosed new problem with uncertain diagnosis  []   1 acute illness with systemic symptoms  []   1 acute complicated injury     Review of Data: I have  [x]  Reviewed/ordered ?  3 unique laboratory, radiology, and/or diagnostic tests noted    []  Reviewed prior external notes and incorporated into patient assessment as noted  []  I have independently interpreted test performed by other physician(s) as noted   []  Discussed management or test interpretation with external provider(s) as noted       Risk of Complication and or Morbidity or Mortality of Patient Management including Social Determinants of Health:   []   I deem the above diagnoses to have a risk of complication, morbidity or mortality of Moderate   []  The diagnosis or treatment of said conditions is significantly limited by social determinants of health as noted.     Visit Diagnoses  / Problems addressed on 04/26/2022:     Encounter Diagnosis   Name Primary?   ? Eosinophilic enteritis Yes          Assessment & Plan :     Chronic diarrhea not clearly exacerbated by food or stress. 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 Consider early IBD but symptoms are chronic. Recommedn repeat colonoscopy in 6-12 months with repeat biopsies. Constipation with overflow also a consideration  1. Eosinophilic enteritis  - Tryptase; Future  - [facility]; Future  - MR enterography wo+w contrast; Future  -Trial of budesonide 9mg  daily  -Consider dietary elimination 6 food elimination diet if ongoing symptoms  RTC in 3 months                  The above recommendation were discussed with the patient. The patient has all questions answered satisfactorily and is in agreement with this recommended plan of care.    Vernie Shanks, MD   04/26/2022 3:13 PM

## 2022-04-26 ENCOUNTER — Ambulatory Visit: Payer: BLUE CROSS/BLUE SHIELD | Attending: Gastroenterology

## 2022-04-26 DIAGNOSIS — K5281 Eosinophilic gastritis or gastroenteritis: Secondary | ICD-10-CM

## 2022-04-26 MED ORDER — BUDESONIDE 3 MG PO CPEP
9 mg | ORAL_CAPSULE | Freq: Every day | ORAL | 3 refills | Status: AC
Start: 2022-04-26 — End: ?

## 2022-04-26 NOTE — Patient Instructions
[facility] Scheduling  [phone]

## 2022-05-14 ENCOUNTER — Ambulatory Visit: Payer: BLUE CROSS/BLUE SHIELD

## 2022-05-24 ENCOUNTER — Ambulatory Visit: Payer: BC Managed Care – HMO | Attending: Gastroenterology

## 2022-06-02 MED ORDER — AZELASTINE HCL 0.1 % NA SOLN
1 | Freq: Two times a day (BID) | NASAL | 2 refills
Start: 2022-06-02 — End: ?

## 2022-06-04 MED ORDER — AZELASTINE HCL 0.1 % NA SOLN
1 | Freq: Two times a day (BID) | NASAL | 0 refills | Status: AC
Start: 2022-06-04 — End: ?

## 2022-06-07 ENCOUNTER — Ambulatory Visit: Payer: BC Managed Care – HMO | Attending: Gastroenterology

## 2022-06-13 ENCOUNTER — Ambulatory Visit: Payer: BC Managed Care – HMO | Attending: Gastroenterology

## 2022-08-01 NOTE — Progress Notes
Gastroenterology Consult   ATTENDING: Vernie Shanks   PATIENT: Chelsea Potter  MRN: [id number]  DOB: [birth year]  DATE OF SERVICE: 08/02/2022  REFERRING PROVIDER: Ramiro Harvest., MD  PRIMARY CARE PROVIDER: Ramiro Harvest., MD    REASON FOR REFERRAL:   Chief Complaint   Patient presents with   ? Follow-up        Subjective:     Chief Complaint:  Chelsea Potter is a 21 y.o. female who presents for chronic digestive issues.  Tried budesonide for 3-4 weeks without interval change in symptoms. Discussed elimination diet but not very consistently. Working on dietary elimination of gluten. Celiac Ab testing was negative. Still with frequent defecation most days.         04/26/22  Ongoing diarrhea for the last 10 days.  Colonoscopy notable for poor preparation.  No change in symptoms following.  Ongoing diarrhea.  Baseline BM frequent 5-10 times per day.        Prior History   Progressive worsening over time and seems to worse since going to school. Symptoms of diarrhea, post meal nausea, and worsening symptoms following antibiotic therapy. Given a trial of FODMAP diet with partial improvement in symptoms. Frequent defecation.  Multiple bowel movements daily.  Maybe worse with gluten. Working out a lot. Currently on spring break, graduating from college this year. GF with colitis.               Past Medical & Surgical History:  PMH  Costochondritis    PSH  Sinus surgery    Relevant Family History:  (yes if checked)  []   Family history of colorectal cancer    []   Family history of GI cancer (not CRC)   []   Family history of IBD   []   Family history of celiac disease    Relevant Social History:    Social History     Tobacco Use   ? Smoking status: Never   ? Smokeless tobacco: Never   Substance Use Topics   ? Alcohol use: Yes     Alcohol/week: 1.8 oz     Types: 3 Glasses of wine per week   ? Drug use: Never             MEDICATIONS:     Medications that the patient states to be currently taking   Medication Sig   ? azelastine 137 mcg/spray nasal spray Spray 1 spray in each nare two (2) times daily.   ? EPINEPHrine (EPIPEN JR) 0.15 mg/0.3 mL injection Inject 0.3 mLs (0.15 mg total) into the muscle as needed for for Anaphylaxis.   ? Loratadine (CLARITIN PO) Take by mouth.         Allergies :    is allergic to amoxicillin, oseltamivir, cefdinir, and prednisone.    PHYSICAL EXAM      Last Recorded Vital Signs:    08/02/22 0800   BP: 121/70   Pulse: 83   Resp: 16   SpO2: 98%      Body mass index is 20.48 kg/m?Marland Kitchen    System Check if examined and normal Positive or additional negative findings   Constit  [x]  General appearance - normal     Eyes  []  Conjuctivae clear       HENMT  []  Normal Lips/teeth/gums, oropharynx, head     Neck  []  Inspection/palpation; thyroid normal      Resp  []  Effort good. Clear to auscultation bilaterally.     CV  [x]  Normal  Rhythm/rate without murmur     Abdomen  [x]  Abdomen soft and NT, no rebound/guarding   [x]  Active BS 4 quadrants   []  No appreciable flank or shifting dullness   []  No hepatosplenomegaly   []  No umbilical hernia    Rectal   []  No external hemorrhoids   []  No masses palpated on DRE   []  Brown stool in vault   []   Appropriate resting and squeeze pressure   []   Appropriate bear down maneuver      MSK  []  Grossly normal strength, ROM     Neuro   []  Grossly normal      Psychiatric  []   Oriented to time, place and person   []   Appropriate insight/judgement & mood/affect                Lab Review:  Lab Results   Component Value Date    WBC 6.26 09/25/2021    HGB 14.1 09/25/2021    HCT 43.5 09/25/2021    MCV 94.2 09/25/2021    PLT 275 09/25/2021     Lab Results   Component Value Date    CREAT 0.78 09/25/2021    BUN 13 09/25/2021    NA 137 09/25/2021    K 4.2 09/25/2021    CL 100 09/25/2021    CO2 26 09/25/2021    ALT 16 09/25/2021    AST 22 09/25/2021    ALKPHOS 60 09/25/2021    BILITOT 1.1 09/25/2021    ALBUMIN 4.8 09/25/2021     03/23/22 Colonsocopy  ?  FINDINGS:   The very distal terminal ileum had evidence of mild erythema the more proximal terminal ileum beyond 1-2 cm was normal in appearance to 10cm.  Forceps biopsies were obtained. The ileocecal valve itself was erythematous and with small aphthous   ulcerations. Forceps biopsies obtained. The preparation of the colon itself was poor with adherent stool coating the majority of the right and transverse colon. The visible mucosa was normal in appearance. Biopsies obtained from the right and left   colon. Normal rectum biopsied using forceps.  ?  A. SMALL INTESTINE, TERMINAL ILEUM (BIOPSY):  - Prominent eosinophils (>100 in a concentrated high power field)   ?  B. ILEOCECAL VALVE (BIOPSY):  - Focal active inflammation  - Prominent eosinophils (>100 in a concentrated high power field)   ?  C. LARGE INTESTINE, RANDOM RIGHT COLON (BIOPSY):  - No histopathologic abnormality  - No features of colitis  ?  D. LARGE INTESTINE, RANDOM LEFT COLON (BIOPSY):  - No histopathologic abnormality  - No features of colitis  ?  E. LARGE INTESTINE, RECTUM (BIOPSY):  - No histopathologic abnormality  - No features of proctitis  ?  COMMENT: Lamina propria eosinophils are prominent in the biopsies from the terminal ileum and ileocecal valve, but do not appear increased in the biopsies from the colon and rectum. Focal active inflammation is also noted in the biopsy from the ileocecal valve with a few foci of cryptitis and a few crypt abscesses. No granuloma, pyloric metaplasia or infectious organisms are identified. The findings raise the possibility of eosinophilic enteritis in the appropriate clinical setting, and drug reaction needs to be ruled out. While Crohn disease is in the differential, the findings are insufficient for the diagnosis.      Medical Decision Making addressed on 08/02/2022:      Number and Complexity of Problems Addressed at the Encounter:   [x]   1 or more chronic  illness with exacerbation, progression, or side effects of treatment  []   2 or more stable chronic illness  [x]   1 undiagnosed new problem with uncertain diagnosis  []   1 acute illness with systemic symptoms  []   1 acute complicated injury     Review of Data: I have  [x]  Reviewed/ordered ? 3 unique laboratory, radiology, and/or diagnostic tests noted    []  Reviewed prior external notes and incorporated into patient assessment as noted  []  I have independently interpreted test performed by other physician(s) as noted   []  Discussed management or test interpretation with external provider(s) as noted       Risk of Complication and or Morbidity or Mortality of Patient Management including Social Determinants of Health:   []   I deem the above diagnoses to have a risk of complication, morbidity or mortality of Moderate   []  The diagnosis or treatment of said conditions is significantly limited by social determinants of health as noted.     Visit Diagnoses  / Problems addressed on 08/02/2022:     Encounter Diagnosis   Name Primary?   ? Eosinophilic enteritis Yes          Assessment & Plan :     Chronic diarrhea not clearly exacerbated by food or stress. 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 Consider early IBD but symptoms are chronic. Recommedn repeat colonoscopy in 6-12 months with repeat biopsies. Constipation with overflow also a consideration. Suggest concurrent endoscopy  -Patient will be scheduled for a colonoscopy at their earliest convenience. The procedure has been discussed in detail, including the risks and the benefits associated with it. Risks include, but are not limited to infection, bleeding, perforation and rarely cardiopulmonary complications. Instructions for the clear liquid diet, preparation and laxative prescription have been provided.   -Patient will be scheduled for a upper endoscopy at their earliest convenience. The procedure has been discussed in detail, including the risks and the benefits associated with it. Risks include, but are not limited to infection, bleeding, perforation and rarely cardiopulmonary complications.     -Suggest Elimination diet in the interim wheat and dairy.  -Fecal calprotectin in Early 2024  -2 Day Preparation Instructions    2 days prior to procedure, light breakfast then start clear liquids.    Noon Take 10mg  of dulcolax    6pm Take one bottle of magensium citrate    Day prior to procedure    Remain on clear liquids and follow remainder of colonoscopy instructions as per standard protocol.                    The above recommendation were discussed with the patient. The patient has all questions answered satisfactorily and is in agreement with this recommended plan of care.    Vernie Shanks, MD   08/02/2022 10:30 AM

## 2022-08-02 ENCOUNTER — Ambulatory Visit: Payer: BLUE CROSS/BLUE SHIELD | Attending: Gastroenterology

## 2022-08-02 DIAGNOSIS — K5281 Eosinophilic gastritis or gastroenteritis: Secondary | ICD-10-CM

## 2022-08-02 MED ORDER — CLENPIQ 10-3.5-12 MG-GM -GM/160ML PO SOLN
0 refills | Status: AC
Start: 2022-08-02 — End: ?

## 2022-08-02 NOTE — Patient Instructions
Yes dietary elimination can also help.  We usually recommend cutting out wheat,dairy, soy,eggs, tree nuts and shellfish.

## 2022-08-03 ENCOUNTER — Ambulatory Visit: Payer: BLUE CROSS/BLUE SHIELD

## 2022-08-15 ENCOUNTER — Inpatient Hospital Stay: Payer: BC Managed Care – HMO | Attending: Gastroenterology

## 2022-08-15 ENCOUNTER — Telehealth: Payer: BC Managed Care – HMO

## 2022-08-15 NOTE — Telephone Encounter
[facility] Procedure Checklist:     Prep instructions have been delivered to patient via:  [x]  Email to ___[email or web address]   []  Fax to ____________  []  Mail to Address on file  []  MyChart  []  Preps provided by the office    Does procedure order match the case scheduled:  [x]  Yes  []  No  []  Other:  Comments:    Encounter sent to [facility] team if the following applies:  []  Procedure scheduled on:  []  Changes have been made to current procedure scheduled.     Sedation Method:  [x]  Patient has been advised of method of anesthesia that will be used in this procedure. Pt has been offered all options and is requesting to be scheduled with MAC. Patient will pay $200 if authorization is denied.    []  Pt has been chosen to schedule procedure with conscious sedation at an ASC.

## 2022-09-12 NOTE — Telephone Encounter
Rescheduled Procedure Checklist:     Informed patient of the following:  _0 Does procedure order match the case rescheduled?  _1  Confirmed new arrival time and location.  _2  Transportation information provided.  _3 Mac Script provided    Prep instructions reviewed and provided to patient via:  _4  Email to __________  _5 Fax to ____________  _6 Mail to Address on file  _7 MyChart  _8 Preps provided by the office    FCU, this procedure has been rescheduled. Please obtain new authorization.    New date of service: 11/30/22    Location:  _9 [facility]  _10 [facility]  _11 [facility]  _12 [facility]

## 2022-11-27 ENCOUNTER — Ambulatory Visit: Payer: BLUE CROSS/BLUE SHIELD

## 2022-11-27 ENCOUNTER — Telehealth: Payer: BC Managed Care – HMO

## 2022-11-27 ENCOUNTER — Telehealth: Payer: BLUE CROSS/BLUE SHIELD

## 2022-11-27 NOTE — Telephone Encounter
Per Dr. Mirna Mires of miralax w/16oz of gatorade to substitute mag citrate.   Spoke with patient she is aware of MD's recommendations. Per aptient understood and had no further questions/concerns

## 2022-11-27 NOTE — Telephone Encounter
Call Back Request      Reason for call back: Patient has a two day prep with mag citrate and states she can't find any at the store bc it is off the market. Please call patient back to advise on two day special prep     Any Symptoms:  []$  Yes  [x]$  No      If yes, what symptoms are you experiencing:    Duration of symptoms (how long):    Have you taken medication for symptoms (OTC or Rx):      If call was taken outside of clinic hours:    []$ Patient or caller has been notified that this message was sent outside of normal clinic hours.     []$ Patient or caller has been warm transferred to the physician's answering service. If applicable, patient or caller informed to please call us back if symptoms progress.  Patient or caller has been notified of the turnaround time of 1-2 business day(s).

## 2022-11-27 NOTE — Telephone Encounter
LM for patient to remind and confirm procedure scheduled for 11/30/2022  Check-in time: 7:00 am  Procedure time:8:00 am   Advised patient to take any blood pressure, heart, anti-seizurals and inhaler medications that morning of the procedure. Patient advised if using an inhaler to bring to the procedure. Advised patient they must have a ride home from the procedure with someone that can accompany them home. Medical procedure staff must be able to confirm ride when they arrive or the procedure will be cancelled.    Advised to call back with any questions or concerns.

## 2022-11-30 DIAGNOSIS — K5281 Eosinophilic gastritis or gastroenteritis: Secondary | ICD-10-CM

## 2022-11-30 MED ADMIN — SODIUM CHLORIDE 0.9 % IV SOLN: INTRAVENOUS | @ 16:00:00 | Stop: 2022-11-30 | NDC 00338004904

## 2022-11-30 MED ADMIN — PROPOFOL 200 MG/20ML IV EMUL: INTRAVENOUS | @ 17:00:00 | Stop: 2022-11-30 | NDC 63323026929

## 2022-11-30 MED ADMIN — PROPOFOL 200 MG/20ML IV EMUL: INTRAVENOUS | @ 16:00:00 | Stop: 2022-11-30 | NDC 63323026929

## 2022-11-30 NOTE — Discharge Instructions
PROCEDURE: [x] Upper endoscopy (EGD)   [x] Colonoscopy    [] Sigmoidoscopy      ACTIVITY:    [x] Avoid any strenuous physical activity for 24 hours    [x] No driving or operating heavy machinery for 24 hours    [x] No alcoholic beverages for 24 hours (may interact with some medications you received during your procedure)   [x] May use a heating pad on low temperature for any abdominal cramping.      DIET:    [x] Resume your usual diet    [] Specific diet instructions:     Call Dr. Hashemi's office [phone] if you experience any of the following:      [x] Severe chest pain, difficulty breathing    [x] Passage of blood clots, black tarry stools, vomiting blood    [x] Severe inflammation at the I.V. site    [x] Difficulty swallowing or persistent vomiting    [x] Abdominal pain    [x] Chills or fever greater than 101 F or 38.4 C within 24 hours of procedure    FOLLOW-UP CARE:        [x]  Call Dr. Hashemi [phone] for appointment as needed   []  Call  Dr. [phone]  for appointment in one month   []  Call your primary MD for appointment as needed   []  Call/Message Dr. Hashemi [phone]  for biopsy results in one week   [x] No  NSAIDs for the next 7 days.   [x] Ok to resume all home medications.

## 2022-11-30 NOTE — H&P
Indication for Procedure: Eosinophilic enteritis    Level of sedation intended for procedure: moderate, deep, MAC  []  Moderate []  Deep [x]  MAC []  Anesthesia   Prior sedation problems []  Yes [x]  No  If yes explain:  Teeth: [x]  Intact []  Loose []  Missing []  Dentures []  Bridges  Uvula Visualized: [x]  Yes []  Partially []  not at all  Neck ROM: [x]   Normal []  Limited  Past Medical History:  History reviewed. No pertinent past medical history.    Past Surgical History:  History reviewed. No pertinent surgical history.    Family History:  History reviewed. No pertinent family history.     Social History:  Social History     Socioeconomic History    Marital status: Single   Tobacco Use    Smoking status: Never    Smokeless tobacco: Never   Substance and Sexual Activity    Alcohol use: Yes     Alcohol/week: 4.2 oz     Types: 7 Glasses of wine per week    Drug use: Never       Allergies:  Allergies   Allergen Reactions    Amoxicillin Hives and Itching    Oseltamivir     Cefdinir Hives, Itching and Rash    Prednisone Hives, Itching and Rash       Current Medications:  No current facility-administered medications for this encounter.       Physical Exam:    Vitals Signs: BP 114/76  ~ Pulse 81  ~ Temp (!) 35.3 ?C (95.6 ?F) (Forehead)  ~ Resp 16  ~ Ht 5' 5'' (1.651 m)  ~ Wt 118 lb (53.5 kg)  ~ LMP  (LMP Unknown)  ~ SpO2 98%  ~ BMI 19.64 kg/m?   Constitutional: Patient appears very comfortable.    HEENT: normal exam  Cardiovascular: Heart sounds are normal.  There no murmurs or gallops.  There is no irregular heartbeat.  Respiratory: Patient is not short of breath.  There is no wheezing or rales.  Lungs are clear to auscultation.  Gastrointestinal: Abdomen is soft and nontender.  Bowel sounds are present.   No masses noted.   Musculoskeletal: There is no edema, cyanosis or clubbing.    Neurological: Patient is awake alert and fully oriented.   Assessment:  Chelsea Potter is a 22 y.o.female who is brought to the endoscopy suite for the procedure because of the following:    Eosinophilic enteritis        The procedure and its possible complications including but not limited to bleeding and perforation and their treatment including but not limited to blood transfusion and even surgery was once again explained to, and understood by the patient. Patient agrees to proceed with the procedure.    ASA Classification:  2    I have reviewed the history and physical and have determined Chelsea Potter to be an appropriate candidate and medically stable to undergo the procedure with planned sedation and analgesia.    Patient cleared for discharge once discharge criteria is met

## 2022-11-30 NOTE — Procedures
PATIENT NAME:  Chelsea Potter  DATE OF BIRTH:   2001-07-13  RECORD NUMBER:  [id number]  DATE/TIME OF PROCEDURE: 11/30/2022 / 08:00:00 AM  ENDOSCOPIST:  Vernie Shanks, MD  REFERRING PHYSICIAN:     FELLOW:       INDICATIONS FOR EXAMINATION:  Eosinophilic enteritis               PROCEDURE PERFORMED:   UPPER GI ENDOSCOPY - biopsy    MEDICATIONS:   MAC anesthesia    PROCEDURE TECHNIQUE:   Informed consent obtained for the procedure including risks, benefits and alternatives and the risk of those alternatives. Informed consent obtained for sedation including risks, benefits and alternatives and the risk of those   alternatives.    EKG, pulse, pulse oximetry and blood pressure were monitored throughout the procedure.     The endoscope was passed with ease through the mouth under direct visualization; it was extended to the 2nd portion of the duodenum. The scope was withdrawn and the mucosa was carefully examined. The views were excellent. The patient's toleration of the   procedure was excellent.      EXTENT OF EXAM:  2nd portion of the duodenum              INSTRUMENTS:   [id number] #[id number]  TECHNICAL DIFFICULTY:  No   LIMITATIONS:    TOLERANCE: Good  VISUALIZATION:  Good    FINDINGS:   Esophagus: The esophagus appeared to be normal.  GE junction was located at 37 cm from the incisors.  Biopsies obtained from the distal and mid esophagus.  Stomach: The stomach appeared to be normal.  Biopsies taken in antrum and mid body greater curvature.    Duodenum: The duodenum appeared to be normal. Biopsies obtained from the bulb and second portion.    ESTIMATED BLOOD LOSS:   None     DIAGNOSIS:  Normal esophagus.  No evidence of erosive esophagitis or Barrett's. Biopsies obtained from the distal and mid esophagus.  Normal stomach. Biopsies taken in antrum and mid body greater curvature for H pylori  Normal duodenum.  Biopsies obtained from the bulb and second portion.    RECOMMENDATIONS:  Follow-up on the results of the biopsy specimens.    CPT CODE:  56213 Esophagogastroduodenoscopy, flexible, transoral; with biopsy, single or multiple          This electronic signature authenticates all electronic and/or handwritten documentation, including orders, generated by the signer during the episode of care contained in this record.  11/30/2022 08:21:09 AM By Vernie Shanks

## 2022-11-30 NOTE — Procedures
PATIENT NAME:  Chelsea Potter  DATE OF BIRTH:   2001-05-20  RECORD NUMBER:  [id number]  DATE/TIME OF PROCEDURE: 11/30/2022 / 08:00:00 AM  ENDOSCOPIST:  Vernie Shanks, MD  REFERRING PHYSICIAN:     FELLOW:       INDICATIONS FOR EXAMINATION:  Eosinophilic enteritis followup               PROCEDURE PERFORMED:   COLONOSCOPY - cold biopsy    MEDICATIONS:   MAC Anesthesia    PROCEDURE TECHNIQUE:   Patient's medications, allergies, past medical, surgical, social and family histories were reviewed and updated as appropriate. A discussion of informed consent was had with the patient and/or the patient's family prior to the   procedure, including sedation.  The alternatives, benefits and risks of  the procedure including but not limited to perforation, hemorrhage, infection, adverse drug reaction and aspiration were discussed    Description of Procedure:  After discussion of informed consent, and appropriate level of sedation were attained, the patient was placed in the left lateral position.  The patient was monitored continuously with pulse oximetry, blood pressure monitoring, and direct observations.      After digital rectal examination, the colonoscope [id number] #[id number] (Ped) was inserted into the rectum and advanced under direct vision to the level of the cecum.  The quality of the colonic preparation was Adequate.  A careful inspection was made as   the colonoscope was withdrawn.  Findings and interventions are described below.?  TOTAL WITHDRAWAL TIME: 00:10:26?  TOTAL INSERTION TIME: 00:15:07    EXTENT OF EXAM:  cecum              INSTRUMENTS:   PCF-H190DL #[id number] (Ped)  TECHNICAL DIFFICULTY:  No   LIMITATIONS:  None  TOLERANCE: Good  VISUALIZATION:  Good    FINDINGS:   Mild erythema and mucosal edema in the transverse colon, the remainder of the colonic mucosa was normal in appearance. Segmental biopsies were obtained. Interval normal appearance of the ileum and ileocecal valve. The terminal ileum was   intubated at least 15cm. Multiple forceps biopsies obtained using forceps.    ESTIMATED BLOOD LOSS:   None     DIAGNOSIS:  Mild erythema and mucosal edema in the transverse colon, the remainder of the colonic mucosa was normal in appearance. Segmental biopsies were obtained. Interval normal appearance of the ileum and ileocecal valve. The terminal ileum was   intubated at least 15cm. Multiple forceps biopsies obtained using forceps.    RECOMMENDATIONS:  Await pathology results    CPT CODE:  95621 Colonoscopy, flexible; with biopsy, single or multiple          This electronic signature authenticates all electronic and/or handwritten documentation, including orders, generated by the signer during the episode of care contained in this record.  11/30/2022 08:43:11 AM By Vernie Shanks

## 2022-12-07 LAB — Tissue Exam

## 2022-12-10 ENCOUNTER — Ambulatory Visit: Payer: BC Managed Care – HMO

## 2022-12-12 ENCOUNTER — Telehealth: Payer: BC Managed Care – HMO

## 2022-12-12 NOTE — Telephone Encounter
Patient scheduled for a fu on 5/30, but is requesting a sooner appt. Prefers latest time. Please advise.

## 2022-12-12 NOTE — Telephone Encounter
Patient has been rescheduled from 5/30 to 3/21

## 2022-12-13 ENCOUNTER — Ambulatory Visit: Payer: BC Managed Care – HMO

## 2022-12-17 ENCOUNTER — Ambulatory Visit: Payer: BC Managed Care – HMO | Attending: Student in an Organized Health Care Education/Training Program

## 2022-12-17 DIAGNOSIS — M1990 Unspecified osteoarthritis, unspecified site: Secondary | ICD-10-CM

## 2022-12-17 DIAGNOSIS — K589 Irritable bowel syndrome without diarrhea: Secondary | ICD-10-CM

## 2022-12-17 NOTE — Progress Notes
PATIENT: Chelsea Potter  MRN: [id number]  DOB: July 17, 2001  DATE OF SERVICE: 12/17/2022    CHIEF COMPLAINT:   Chief Complaint   Patient presents with    Referral / Auth     Pt requesting referral for rheumatologist and labs.        HPI   Chelsea Potter is a 22 y.o. female presents for   Chief Complaint   Patient presents with    Referral / Auth     Pt requesting referral for rheumatologist and labs.       Patient is here for f/u     MEDS     Medications that the patient states to be currently taking   Medication Sig    azelastine 137 mcg/spray nasal spray Spray 1 spray in each nare two (2) times daily.    EPINEPHrine (EPIPEN JR) 0.15 mg/0.3 mL injection Inject 0.3 mLs (0.15 mg total) into the muscle as needed for for Anaphylaxis.    Loratadine (CLARITIN PO) Take by mouth.       PHYSICAL EXAM      Last Recorded Vital Signs:    12/17/22 1102   BP: 115/75   Pulse: 64   Resp: 14   Temp: 36.6 ?C (97.8 ?F)   SpO2: 99%     Body mass index is 19.63 kg/m?Marland Kitchen    System Check if normal Positive or additional negative findings   Constit  [x]  General appearance     Eyes  [x]  Conj/Lids []  Pupils  []  Fundi     HENMT  []  External ears/nose []  Otoscopy   [x]  Gross Hearing []  Nasal mucosa   []  Lips/teeth/gums []  Oropharynx    []  mucus membranes []  Head     Neck  []  Inspection/palpation []  Thyroid     Resp  [x]  Effort []  Wheezing    []  Auscultation  []  Crackles     CV  [x]  Rhythm/rate   []  Murmurs   []  LEE   []  JVP non-elevated    Normal pulses:   []  Radial []  Femoral  []  Pedal     Breast  []  Inspection []  Palpation     GI  []  abd masses    []  tenderness   []  rebound/guarding   []  Liver/spleen []  Rectal     GU  M: []  Scrotum []  Penis []  Prostate   F:  []  External []  vaginal wall        []  Cervix  []  mucus        []  Uterus    []  Adnexa      Lymph  []  Neck []  Axillae []  Groin     MSK Specify site examined:    []  Inspect/palp []  ROM   []  Stability [x]  Strength/tone         Skin  []  Inspection []  Palpation     Neuro  [x]  CN2-12 intact grossly   [x]  Alert and oriented   []  DTR      [x]  Muscle strength      []  Sensation   [x]  Gait/balance     Psych  [x]  Insight/judgement     [x]  Mood/affect    [x]  Gross cognition        LABS/STUDIES   I have:   []  Reviewed/ordered []  1 []  2 []  ? 3 unique laboratory, radiology, and/or diagnostic tests noted below    []  Reviewed []  1 []  2 []  ? 3 prior external notes and incorporated into patient assessment    []   Discussed management or test interpretation with external provider(s) as noted       Lab Studies:  Admission on 11/30/2022, Discharged on 11/30/2022   Component Date Value Ref Range Status    Preg Test, Ur, Manual 11/30/2022 Negative   Final    CASE REPORT 11/30/2022    Final                    Value:Surgical Pathology Report                         Case: [id number]                                Authorizing Provider:  Vernie Shanks, MD   Collected:           11/30/2022 2841              Ordering Location:     [facility]     Received:            11/30/2022 1740                                     Digestive Diseases                                                                                  Procedure Unit                                                               Pathologist:           Britta Mccreedy, MD                                                           Specimens:   A) - Duodenum, duodenum bx's                                                                        B) - Stomach, gastric bx's  C) - Esophagus, distal esophagus bx's                                                               D) - Esophagus, mid esophagus bx's                                                                                            E) - Terminal Ileum, terminal ileum bx's                                                            F) - Ileocecal valve, ileocecal valve bx's                                                          G) - Colon, right colon bx's                                                                        H) - Transverse Colon, transverse colon bx's                                                        I) - Colon, left colon bx's                                                                         J) - Rectum, rectum bx's                                                                   CLINICAL INFORMATION 11/30/2022    Final  Value:This result contains rich text formatting which cannot be displayed here.    FINAL DIAGNOSIS 11/30/2022    Final                    Value:This result contains rich text formatting which cannot be displayed here.    MICROSCOPIC DESCRIPTION 11/30/2022    Final                    Value:This result contains rich text formatting which cannot be displayed here.    GROSS DESCRIPTION 11/30/2022    Final                    Value:This result contains rich text formatting which cannot be displayed here.    Signatures 11/30/2022    Final                    Value:This result contains rich text formatting which cannot be displayed here.       ACC/AHA Cholesterol Management Guideline Recommendations:   Not applicable      ASCVD Risk Score    10-year ASCVD risk  cannot be calculated because at least one required variable is not available in [facility]. as of 11:14 AM on 12/17/2022.   10-year ASCVD risk with optimal risk factors  cannot be calculated.   Values used to calculate ASCVD Risk Score    Age  17 y.o. Cannot calculate risk because age is not between 6 and 51 years old.   Gender  female   Race  White   HDL Cholesterol  86 mg/dL. (measured on 09/25/2021)   LDL Cholesterol  123 mg/dL. (measured on 09/25/2021)   Total Cholesterol  216 mg/dL. (measured on 09/25/2021)   Systolic Blood Pressure  115 mm Hg. (measured on 12/17/2022)   Blood Pressure Medication Present  No   Smoking Status  currently not a smoker   Diabetes Present  No     Click here for the [employer] ASCVD Cardiovascular Risk Estimator Plus tool Office manager).        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    EXAM:  NUCLEAR MEDICINE WHOLE BODY BONE SCAN    TECHNIQUE:  Whole body anterior and posterior images were obtained approximately  3 hours after intravenous injection of radiopharmaceutical.    RADIOPHARMACEUTICALS:  20.6 mCi Technetium-24m MDP IV    COMPARISON:  None.    FINDINGS:  There is mild, symmetric focal radiotracer uptake of the bilateral  sternoclavicular joints, as well as a subtle focus of radiotracer  uptake at the sternomanubrial junction. No other abnormal  radiotracer uptake. Expected urinary tract uptake and excretion.    IMPRESSION:  There is mild, symmetric focal radiotracer uptake of the bilateral  sternoclavicular joints, as well as a subtle focus of radiotracer  uptake at the sternomanubrial junction. Findings are nonspecific but  potentially in keeping with suspicion for SAPHO syndrome      Electronically Signed   By: Lauralyn Primes M.D.   On: 05/30/2021 10:37        Imaging Results - NM Bone Scan Whole Body (05/29/2021 2:35 PM EDT)  Procedure Note   Jearld Lesch, MD - 05/30/2021   Formatting of this note might be different from the original.  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    EXAM:  NUCLEAR  MEDICINE WHOLE BODY BONE SCAN    TECHNIQUE:  Whole body anterior and posterior images were obtained approximately  3 hours after intravenous injection of radiopharmaceutical.    RADIOPHARMACEUTICALS: 20.6 mCi Technetium-43m MDP IV    COMPARISON: None.    FINDINGS:  There is mild, symmetric focal radiotracer uptake of the bilateral  sternoclavicular joints, as well as a subtle focus of radiotracer  uptake at the sternomanubrial junction. No other abnormal  radiotracer uptake. Expected urinary tract uptake and excretion.    IMPRESSION:  There is mild, symmetric focal radiotracer uptake of the bilateral  sternoclavicular joints, as well as a subtle focus of radiotracer  uptake at the sternomanubrial junction. Findings are nonspecific but  potentially in keeping with suspicion for SAPHO syndrome       A&P   Chelsea Potter is a 22 y.o. female presenting for   Chief Complaint   Patient presents with    Referral / Auth     Pt requesting referral for rheumatologist and labs.         PROBLEM & ORDERS    ICD-10-CM    1. Arthritis  M19.90 Referral to Rheumatology      2. Irritable bowel syndrome, unspecified type  K58.9       3. 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  Last imaging noted for the following:   There is mild, symmetric focal radiotracer uptake of the bilateral  sternoclavicular joints, as well as a subtle focus of radiotracer  uptake at the sternomanubrial junction. Findings are nonspecific but  potentially in keeping with suspicion for SAPHO syndrome    Usually managed via steroid injection  Interested in establishing with [facility]  Referral provided    Eosinophil enteritis:   Will f/u w/ GI on 12/27/22  Last colonoscopy and biopsy results noted  Recommend discuss with GI protein shake supplementation, as weight loss noted      The increased number of eosinophils is significant in the terminal ileum and ileocecal  valve. Multiple foci of activity (cryptitis, not crypt abscess) are noted in the colonic biopsies. No  granulomas or infectious agents are seen. There are no metaplastic changes or significant chronic  inflammation or crypt architectural changes. Features could be compatible with the clinical impression  of eosinophilic enteritis, although drug injury and other etiologies are not excluded.    The above recommendation were discussed with the patient.  The patient has all questions answered satisfactorily and is in agreement with this recommended plan of care.        Author:  Ramiro Harvest, MD 12/17/2022 11:14 AM

## 2022-12-25 NOTE — Progress Notes
Gastroenterology Consult   ATTENDING: Vernie Shanks   PATIENT: Chelsea Potter  MRN: [id number]  DOB: 2001/02/27  DATE OF SERVICE: 12/27/2022  REFERRING PROVIDER: Vernie Shanks, MD  PRIMARY CARE PROVIDER: Ramiro Harvest., MD    REASON FOR REFERRAL:   Chief Complaint   Patient presents with    Follow-up        Subjective:     Chief Complaint:  Chelsea Potter is a 22 y.o. female who presents for chronic digestive issues. Bowel movements are generally normal. Minimal diarrhea. Ongoing episodic nausea. Even with avoidance of gluten. Labs reviewed. Colonoscopy results reviewed. Interval improvement in endoscopic findings. Of note different prep given for most recent examination.     07/2022   Tried budesonide for 3-4 weeks without interval change in symptoms. Discussed elimination diet but not very consistently. Working on dietary elimination of gluten. Celiac Ab testing was negative. Still with frequent defecation most days.         04/26/22  Ongoing diarrhea for the last 10 days.  Colonoscopy notable for poor preparation.  No change in symptoms following.  Ongoing diarrhea.  Baseline BM frequent 5-10 times per day.        Prior History   Progressive worsening over time and seems to worse since going to school. Symptoms of diarrhea, post meal nausea, and worsening symptoms following antibiotic therapy. Given a trial of FODMAP diet with partial improvement in symptoms. Frequent defecation.  Multiple bowel movements daily.  Maybe worse with gluten. Working out a lot. Currently on spring break, graduating from college this year. GF with colitis.               Past Medical & Surgical History:  PMH  Costochondritis    PSH  Sinus surgery    Relevant Family History:  (yes if checked)  []   Family history of colorectal cancer    []   Family history of GI cancer (not CRC)   []   Family history of IBD   []   Family history of celiac disease    Relevant Social History:    Social History     Tobacco Use    Smoking status: Never Smokeless tobacco: Never   Substance Use Topics    Alcohol use: Yes     Alcohol/week: 4.2 oz     Types: 7 Glasses of wine per week    Drug use: Never             MEDICATIONS:     No outpatient medications have been marked as taking for the 12/27/22 encounter (Office Visit) with Vernie Shanks, MD.         Allergies :    is allergic to amoxicillin, oseltamivir, cefdinir, and prednisone.    PHYSICAL EXAM      Last Recorded Vital Signs:    12/27/22 1508   BP: 115/70   Pulse: 71   Resp: 20   Temp: 36.7 ?C (98 ?F)   SpO2: 98%        Body mass index is 19.99 kg/m?Marland Kitchen    System Check if examined and normal Positive or additional negative findings   Constit  [x]  General appearance - normal     Eyes  []  Conjuctivae clear       HENMT  []  Normal Lips/teeth/gums, oropharynx, head     Neck  []  Inspection/palpation; thyroid normal      Resp  []  Effort good. Clear to auscultation bilaterally.     CV  [x]  Normal Rhythm/rate  without murmur     Abdomen  [x]  Abdomen soft and NT, no rebound/guarding   [x]  Active BS 4 quadrants   []  No appreciable flank or shifting dullness   []  No hepatosplenomegaly   []  No umbilical hernia    Rectal   []  No external hemorrhoids   []  No masses palpated on DRE   []  Brown stool in vault   []   Appropriate resting and squeeze pressure   []   Appropriate bear down maneuver      MSK  []  Grossly normal strength, ROM     Neuro   []  Grossly normal      Psychiatric  []   Oriented to time, place and person   []   Appropriate insight/judgement & mood/affect                Lab Review:  Lab Results   Component Value Date    WBC 6.26 09/25/2021    HGB 14.1 09/25/2021    HCT 43.5 09/25/2021    MCV 94.2 09/25/2021    PLT 275 09/25/2021     Lab Results   Component Value Date    CREAT 0.78 09/25/2021    BUN 13 09/25/2021    NA 137 09/25/2021    K 4.2 09/25/2021    CL 100 09/25/2021    CO2 26 09/25/2021    ALT 16 09/25/2021    AST 22 09/25/2021    ALKPHOS 60 09/25/2021    BILITOT 1.1 09/25/2021    ALBUMIN 4.8 09/25/2021       11/30/22  DIAGNOSIS:  Mild erythema and mucosal edema in the transverse colon, the remainder of the colonic mucosa was normal in appearance. Segmental biopsies were obtained. Interval normal appearance of the ileum and ileocecal valve. The terminal ileum was   intubated at least 15cm. Multiple forceps biopsies obtained using forceps.     RECOMMENDATIONS:  Await pathology results      DIAGNOSIS:  Normal esophagus.  No evidence of erosive esophagitis or Barrett's. Biopsies obtained from the distal and mid esophagus.  Normal stomach. Biopsies taken in antrum and mid body greater curvature for H pylori  Normal duodenum.  Biopsies obtained from the bulb and second portion.  CPT CODE:  16109 Colonoscopy, flexible; with biopsy, single or multiple     A.  DUODENUM (BIOPSY):  - Duodenal mucosa with no histopathologic abnormality     B.  STOMACH (BIOPSY):  - Antral and oxyntic mucosa with no histopathologic abnormality  - No H. pylori organisms  - No intestinal metaplasia or dysplasia     C.  ESOPHAGUS, DISTAL (BIOPSY):  - Squamous mucosa with no histopathologic abnormality  - No intraepithelial eosinophils     D.  ESOPHAGUS, MID (BIOPSY):  - Squamous mucosa with no histopathologic abnormality  - No intraepithelial eosinophils     E.  TERMINAL ILEUM (BIOPSY):  - Increased eosinophils in the lamina propria (more than 90 per hpf)     F.  ILEOCECAL VALVE (BIOPSY):  - Increased eosinophils in the lamina propria (more than 90 per hpf)     G.  COLON, RIGHT (BIOPSY):  - Mild active colitis  - Mildly increased eosinophils (up to 35 per hpf)     H.  COLON, TRANSVERSE (BIOPSY):  - Mild active colitis     I.  COLON, LEFT (BIOPSY):  - Minimal active colitis  - Mildly increased eosinophils (up to 50 per hpf)     J.  RECTUM (BIOPSY):  - Focal  active proctitis      COMMENT: The increased number of eosinophils is significant in the terminal ileum and ileocecal valve. Multiple foci of activity (cryptitis, not crypt abscess) are noted in the colonic biopsies. No granulomas or infectious agents are seen. There are no metaplastic changes or significant chronic inflammation or crypt architectural changes. Features could be compatible with the clinical impression of eosinophilic enteritis, although drug injury and other etiologies are not excluded.  03/23/22 Colonsocopy     FINDINGS:   The very distal terminal ileum had evidence of mild erythema the more proximal terminal ileum beyond 1-2 cm was normal in appearance to 10cm.  Forceps biopsies were obtained. The ileocecal valve itself was erythematous and with small aphthous   ulcerations. Forceps biopsies obtained. The preparation of the colon itself was poor with adherent stool coating the majority of the right and transverse colon. The visible mucosa was normal in appearance. Biopsies obtained from the right and left   colon. Normal rectum biopsied using forceps.     A. SMALL INTESTINE, TERMINAL ILEUM (BIOPSY):  - Prominent eosinophils (>100 in a concentrated high power field)      B. ILEOCECAL VALVE (BIOPSY):  - Focal active inflammation  - Prominent eosinophils (>100 in a concentrated high power field)      C. LARGE INTESTINE, RANDOM RIGHT COLON (BIOPSY):  - No histopathologic abnormality  - No features of colitis     D. LARGE INTESTINE, RANDOM LEFT COLON (BIOPSY):  - No histopathologic abnormality  - No features of colitis     E. LARGE INTESTINE, RECTUM (BIOPSY):  - No histopathologic abnormality  - No features of proctitis     COMMENT: Lamina propria eosinophils are prominent in the biopsies from the terminal ileum and ileocecal valve, but do not appear increased in the biopsies from the colon and rectum. Focal active inflammation is also noted in the biopsy from the ileocecal valve with a few foci of cryptitis and a few crypt abscesses. No granuloma, pyloric metaplasia or infectious organisms are identified. The findings raise the possibility of eosinophilic enteritis in the appropriate clinical setting, and drug reaction needs to be ruled out. While Crohn disease is in the differential, the findings are insufficient for the diagnosis.      Medical Decision Making addressed on 12/27/2022:      Number and Complexity of Problems Addressed at the Encounter:   [x]   1 or more chronic illness with exacerbation, progression, or side effects of treatment  []   2 or more stable chronic illness  []   1 undiagnosed new problem with uncertain diagnosis  []   1 acute illness with systemic symptoms  []   1 acute complicated injury     Review of Data: I have  [x]  Reviewed/ordered ? 3 unique laboratory, radiology, and/or diagnostic tests noted    []  Reviewed prior external notes and incorporated into patient assessment as noted  []  I have independently interpreted test performed by other physician(s) as noted   []  Discussed management or test interpretation with external provider(s) as noted       Risk of Complication and or Morbidity or Mortality of Patient Management including Social Determinants of Health:   []   I deem the above diagnoses to have a risk of complication, morbidity or mortality of Moderate   []  The diagnosis or treatment of said conditions is significantly limited by social determinants of health as noted.     Visit Diagnoses  / Problems addressed on 12/27/2022:  Encounter Diagnosis   Name Primary?    Enteritis Yes            Assessment & Plan :     Chronic diarrhea not clearly exacerbated by food or stress. 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 Consider early IBD but symptoms are chronic. Marked improvement in endoscopy with stable to worsening histology. Symptoms not clearly related to enodoscopic activity as symptoms are mostly nausea and foregut biopsies are normal  -Check Calprotectin  -Check MRE  -If these are suggestive of inflammation can monitor the reponse to therapy, otherwise start budesonide suggst three month trial and repeat colon  -Alternatively dupixent off label another option for therapy.              The above recommendation were discussed with the patient. The patient has all questions answered satisfactorily and is in agreement with this recommended plan of care.    Vernie Shanks, MD   12/27/2022 3:49 PM

## 2022-12-27 ENCOUNTER — Ambulatory Visit: Payer: BC Managed Care – HMO | Attending: Gastroenterology

## 2022-12-27 DIAGNOSIS — K529 Noninfective gastroenteritis and colitis, unspecified: Secondary | ICD-10-CM

## 2022-12-27 NOTE — Patient Instructions
[facility] Scheduling  [phone]

## 2022-12-28 ENCOUNTER — Ambulatory Visit: Payer: BC Managed Care – HMO

## 2022-12-31 ENCOUNTER — Ambulatory Visit: Payer: BC Managed Care – HMO

## 2022-12-31 DIAGNOSIS — Z0000 Encounter for general adult medical examination without abnormal findings: Secondary | ICD-10-CM

## 2023-01-01 ENCOUNTER — Ambulatory Visit: Payer: BLUE CROSS/BLUE SHIELD

## 2023-01-10 NOTE — Telephone Encounter
Results are in Care everywhere, but I've added the CBC results here.

## 2023-01-11 DIAGNOSIS — D721 Eosinophilia, unspecified type: Secondary | ICD-10-CM

## 2023-01-17 ENCOUNTER — Inpatient Hospital Stay: Payer: BC Managed Care – HMO | Attending: Gastroenterology

## 2023-01-17 DIAGNOSIS — K529 Noninfective gastroenteritis and colitis, unspecified: Secondary | ICD-10-CM

## 2023-01-17 MED ADMIN — BREEZA PO SUSP: 1500 mL | ORAL | @ 16:00:00 | Stop: 2023-01-17 | NDC 08252000094

## 2023-01-17 MED ADMIN — GADOBUTROL 1 MMOL/ML IV SOLN: 7.5 mL | INTRAVENOUS | @ 16:00:00 | Stop: 2023-01-17 | NDC 65219028107

## 2023-01-17 MED ADMIN — GLUCAGON (RDNA) 1 MG (MULTI-GPI): 1 mg | INTRAMUSCULAR | @ 16:00:00 | Stop: 2023-01-17 | NDC 00002803101

## 2023-03-07 ENCOUNTER — Ambulatory Visit: Payer: BC Managed Care – HMO | Attending: Gastroenterology

## 2023-03-23 MED ORDER — AZELASTINE HCL 0.1 % NA SOLN
2 refills
Start: 2023-03-23 — End: ?

## 2023-03-25 MED ORDER — AZELASTINE HCL 0.1 % NA SOLN
1 | Freq: Two times a day (BID) | NASAL | 0 refills
Start: 2023-03-25 — End: ?

## 2023-03-25 MED ORDER — AZELASTINE HCL 0.1 % NA SOLN
2 refills
Start: 2023-03-25 — End: ?

## 2023-03-26 MED ORDER — AZELASTINE HCL 0.1 % NA SOLN
2 refills
Start: 2023-03-26 — End: ?

## 2023-03-26 MED ORDER — AZELASTINE HCL 0.1 % NA SOLN
1 | Freq: Two times a day (BID) | NASAL | 0 refills
Start: 2023-03-26 — End: ?

## 2023-03-27 MED ORDER — AZELASTINE HCL 0.1 % NA SOLN
1 | Freq: Two times a day (BID) | NASAL | 0 refills | Status: AC
Start: 2023-03-27 — End: ?

## 2023-03-28 MED ORDER — AZELASTINE HCL 0.1 % NA SOLN
1 | Freq: Two times a day (BID) | NASAL | 0 refills
Start: 2023-03-28 — End: ?

## 2023-03-28 MED ORDER — AZELASTINE HCL 0.1 % NA SOLN
2 refills
Start: 2023-03-28 — End: ?

## 2023-04-02 ENCOUNTER — Ambulatory Visit: Payer: BC Managed Care – HMO | Attending: Gastroenterology

## 2023-04-02 NOTE — Progress Notes
Gastroenterology Consult   ATTENDING: Vernie Shanks, MD  PATIENT: Chelsea Potter  MRN: [id number]  DOB: [birth year]  DATE OF SERVICE: 04/02/2023  REFERRING PROVIDER: Vernie Shanks, MD  PRIMARY CARE PROVIDER: Ramiro Harvest., MD    REASON FOR REFERRAL:   Chief Complaint   Patient presents with    Follow-up     Enteritis - flare up episode for about 4 weeks        Subjective:     Chief Complaint:  Chelsea Potter is a 22 y.o. female who presents for chronic digestive issues. Bowel movements are generally normal. Minimal diarrhea. Ongoing episodic nausea. Even with avoidance of gluten. Labs reviewed. Colonoscopy results reviewed. Interval improvement in endoscopic findings. Of note different prep given for most recent examination.     04/02/2023  For the last few weeks, pt reports exacerbation of sx. She woke up groggy and nauseous. Her diarrhea worsened. Of note, pt was training for a marathon and for 3 weeks was unable to train due to sx. 2 years ago, pt tried budesonide without improvement. Endorses stable weight at this time. Reports associated nausea and diarrhea. Denies weight loss.    07/2022  Tried budesonide for 3-4 weeks without interval change in symptoms. Discussed elimination diet but not very consistently. Working on dietary elimination of gluten. Celiac Ab testing was negative. Still with frequent defecation most days.     04/26/22  Ongoing diarrhea for the last 10 days.  Colonoscopy notable for poor preparation.  No change in symptoms following.  Ongoing diarrhea.  Baseline BM frequent 5-10 times per day.      Prior History   Progressive worsening over time and seems to worse since going to school. Symptoms of diarrhea, post meal nausea, and worsening symptoms following antibiotic therapy. Given a trial of FODMAP diet with partial improvement in symptoms. Frequent defecation.  Multiple bowel movements daily.  Maybe worse with gluten. Working out a lot. Currently on spring break, graduating from college this year. GF with colitis.     Past Medical & Surgical History:  PMH  Costochondritis    PSH  Sinus surgery    Relevant Family History:  (yes if checked)  []   Family history of colorectal cancer    []   Family history of GI cancer (not CRC)   []   Family history of IBD   []   Family history of celiac disease    Relevant Social History:    Social History     Tobacco Use    Smoking status: Never    Smokeless tobacco: Never   Substance Use Topics    Alcohol use: Yes     Alcohol/week: 4.2 oz     Types: 7 Glasses of wine per week    Drug use: Never             MEDICATIONS:     No outpatient medications have been marked as taking for the 04/02/23 encounter (Office Visit) with Vernie Shanks, MD.         Allergies :    is allergic to amoxicillin, oseltamivir, cefdinir, and prednisone.    PHYSICAL EXAM      Last Recorded Vital Signs:    04/02/23 1604   BP: 107/70   Pulse: 63   Resp: 16   SpO2: 98%          Body mass index is 19.83 kg/m?Marland Kitchen    System Check if examined and normal Positive or additional negative findings   Constit  [x]   General appearance - normal     Eyes  []  Conjuctivae clear       HENMT  []  Normal Lips/teeth/gums, oropharynx, head     Neck  []  Inspection/palpation; thyroid normal      Resp  []  Effort good. Clear to auscultation bilaterally.     CV  [x]  Normal Rhythm/rate without murmur     Abdomen  [x]  Abdomen soft and NT, no rebound/guarding   [x]  Active BS 4 quadrants   []  No appreciable flank or shifting dullness   []  No hepatosplenomegaly   []  No umbilical hernia    Rectal   []  No external hemorrhoids   []  No masses palpated on DRE   []  Brown stool in vault   []   Appropriate resting and squeeze pressure   []   Appropriate bear down maneuver      MSK  []  Grossly normal strength, ROM     Neuro   []  Grossly normal      Psychiatric  []   Oriented to time, place and person   []   Appropriate insight/judgement & mood/affect                Lab Review:  Lab Results   Component Value Date    WBC 8.76 02/21/2023    HGB 14.5 02/21/2023    HCT 43.9 02/21/2023    MCV 92.6 02/21/2023    PLT 326 02/21/2023     Lab Results   Component Value Date    CREAT 0.78 09/25/2021    BUN 13 09/25/2021    NA 137 09/25/2021    K 4.2 09/25/2021    CL 100 09/25/2021    CO2 26 09/25/2021    ALT 16 09/25/2021    AST 22 09/25/2021    ALKPHOS 60 09/25/2021    BILITOT 1.1 09/25/2021    ALBUMIN 4.8 09/25/2021       11/30/22  DIAGNOSIS:  Mild erythema and mucosal edema in the transverse colon, the remainder of the colonic mucosa was normal in appearance. Segmental biopsies were obtained. Interval normal appearance of the ileum and ileocecal valve. The terminal ileum was   intubated at least 15cm. Multiple forceps biopsies obtained using forceps.     RECOMMENDATIONS:  Await pathology results      DIAGNOSIS:  Normal esophagus.  No evidence of erosive esophagitis or Barrett's. Biopsies obtained from the distal and mid esophagus.  Normal stomach. Biopsies taken in antrum and mid body greater curvature for H pylori  Normal duodenum.  Biopsies obtained from the bulb and second portion.  CPT CODE:  78295 Colonoscopy, flexible; with biopsy, single or multiple     A.  DUODENUM (BIOPSY):  - Duodenal mucosa with no histopathologic abnormality     B.  STOMACH (BIOPSY):  - Antral and oxyntic mucosa with no histopathologic abnormality  - No H. pylori organisms  - No intestinal metaplasia or dysplasia     C.  ESOPHAGUS, DISTAL (BIOPSY):  - Squamous mucosa with no histopathologic abnormality  - No intraepithelial eosinophils     D.  ESOPHAGUS, MID (BIOPSY):  - Squamous mucosa with no histopathologic abnormality  - No intraepithelial eosinophils     E.  TERMINAL ILEUM (BIOPSY):  - Increased eosinophils in the lamina propria (more than 90 per hpf)     F.  ILEOCECAL VALVE (BIOPSY):  - Increased eosinophils in the lamina propria (more than 90 per hpf)     G.  COLON, RIGHT (BIOPSY):  - Mild active colitis  -  Mildly increased eosinophils (up to 35 per hpf)     H. COLON, TRANSVERSE (BIOPSY):  - Mild active colitis     I.  COLON, LEFT (BIOPSY):  - Minimal active colitis  - Mildly increased eosinophils (up to 50 per hpf)     J.  RECTUM (BIOPSY):  - Focal active proctitis      COMMENT: The increased number of eosinophils is significant in the terminal ileum and ileocecal valve. Multiple foci of activity (cryptitis, not crypt abscess) are noted in the colonic biopsies. No granulomas or infectious agents are seen. There are no metaplastic changes or significant chronic inflammation or crypt architectural changes. Features could be compatible with the clinical impression of eosinophilic enteritis, although drug injury and other etiologies are not excluded.  03/23/22 Colonsocopy     FINDINGS:   The very distal terminal ileum had evidence of mild erythema the more proximal terminal ileum beyond 1-2 cm was normal in appearance to 10cm.  Forceps biopsies were obtained. The ileocecal valve itself was erythematous and with small aphthous   ulcerations. Forceps biopsies obtained. The preparation of the colon itself was poor with adherent stool coating the majority of the right and transverse colon. The visible mucosa was normal in appearance. Biopsies obtained from the right and left   colon. Normal rectum biopsied using forceps.     A. SMALL INTESTINE, TERMINAL ILEUM (BIOPSY):  - Prominent eosinophils (>100 in a concentrated high power field)      B. ILEOCECAL VALVE (BIOPSY):  - Focal active inflammation  - Prominent eosinophils (>100 in a concentrated high power field)      C. LARGE INTESTINE, RANDOM RIGHT COLON (BIOPSY):  - No histopathologic abnormality  - No features of colitis     D. LARGE INTESTINE, RANDOM LEFT COLON (BIOPSY):  - No histopathologic abnormality  - No features of colitis     E. LARGE INTESTINE, RECTUM (BIOPSY):  - No histopathologic abnormality  - No features of proctitis     COMMENT: Lamina propria eosinophils are prominent in the biopsies from the terminal ileum and ileocecal valve, but do not appear increased in the biopsies from the colon and rectum. Focal active inflammation is also noted in the biopsy from the ileocecal valve with a few foci of cryptitis and a few crypt abscesses. No granuloma, pyloric metaplasia or infectious organisms are identified. The findings raise the possibility of eosinophilic enteritis in the appropriate clinical setting, and drug reaction needs to be ruled out. While Crohn disease is in the differential, the findings are insufficient for the diagnosis.      Calprotectin 03/21/2023  Calprotectin, Fecal 47 <=49 ug/g Final       Medical Decision Making addressed on 04/02/2023:      Number and Complexity of Problems Addressed at the Encounter:   [x]   1 or more chronic illness with exacerbation, progression, or side effects of treatment  []   2 or more stable chronic illness  []   1 undiagnosed new problem with uncertain diagnosis  []   1 acute illness with systemic symptoms  []   1 acute complicated injury     Review of Data: I have  [x]  Reviewed/ordered ? 3 unique laboratory, radiology, and/or diagnostic tests noted    []  Reviewed prior external notes and incorporated into patient assessment as noted  []  I have independently interpreted test performed by other physician(s) as noted   []  Discussed management or test interpretation with external provider(s) as noted  Risk of Complication and or Morbidity or Mortality of Patient Management including Social Determinants of Health:   []   I deem the above diagnoses to have a risk of complication, morbidity or mortality of Moderate   []  The diagnosis or treatment of said conditions is significantly limited by social determinants of health as noted.     Visit Diagnoses  / Problems addressed on 04/02/2023:     Encounter Diagnosis   Name Primary?    Eosinophilic enteritis Yes              Assessment & Plan :       Chronic diarrhea not clearly exacerbated by food or stress. 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 Consider early IBD but symptoms are chronic. Marked improvement in endoscopy with stable to worsening histology.   -Currently doing well, will trial Dupixent off-label if sx recur  - +/- stool testing depending on duration of time between symptom exacerbations  -Labs reviewed and normal.     The above recommendation were discussed with the patient. The patient has all questions answered satisfactorily and is in agreement with this recommended plan of care.    SCRIBE SIGNATURE(S):  I, Lewie Chamber, am scribing for, and in the presence of Dr. Vernie Shanks.    PHYSICIAN SIGNATURE(S):  I, Dr. Vernie Shanks, personally performed the services described in this documentation, as scribed by Lewie Chamber in my presence, and it is both accurate and complete.

## 2023-05-23 ENCOUNTER — Ambulatory Visit: Payer: BLUE CROSS/BLUE SHIELD

## 2023-05-23 NOTE — Telephone Encounter
Hi we were going to try dupixent I would have sent budesonide on my own.

## 2023-05-24 ENCOUNTER — Telehealth: Payer: BLUE CROSS/BLUE SHIELD

## 2023-05-24 MED ORDER — DUPIXENT 300 MG/2ML SC SOPN
300 mg | SUBCUTANEOUS | 5 refills | 28.00000 days | Status: AC
Start: 2023-05-24 — End: ?

## 2023-05-24 NOTE — Addendum Note
Addended by: Vernie Shanks on: 05/24/2023 09:22 AM     Modules accepted: Orders

## 2023-05-24 NOTE — Telephone Encounter
Spoke to [employer] and the pharmacist they state the PA was given for 30 days and it needs to be 28 days, resubmitted PA for 28 days. Will wait response.

## 2023-05-24 NOTE — Telephone Encounter
Message to Practice/Provider      Message: [facility] states dupixent PA was approved today but it is not covering the dosage which is 300 mg. Jennet Maduro states office would need to contact PA department. Thank you.    PA department  # [phone]  option 2      Patient or caller has been notified that their message will be reviewed within 1-2 business days.

## 2023-05-24 NOTE — Telephone Encounter
PA Submitted via cover my meds.

## 2023-06-14 ENCOUNTER — Ambulatory Visit: Payer: BC Managed Care – HMO

## 2023-06-14 ENCOUNTER — Telehealth: Payer: BC Managed Care – HMO

## 2023-06-14 NOTE — Telephone Encounter
Pt denied any pain, itchiness, or hot to touch.  Advised pt that the lump should go away.  If lump does get bigger or symptoms change, advised pt to contact clinic. Pt understood and had no further questions.

## 2023-06-14 NOTE — Patient Instructions
Patient was instructed on the use and storage of Dupixent pen and pen needles per MD request.  Patient educated on rotation of injection sites and given visual demonstration on self.  Discussed needle disposal, and use. Patient verbalized understanding of all instructions, was able to demonstrate back to nurse with no additional questions. Showed patient instructions given during teaching for reference during use at home.

## 2023-06-14 NOTE — Telephone Encounter
PDL Call to Clinic    Reason for Call: Patient has a swollen lump on their leg after injection.     Appointment Related?  []  Yes  [x]  No     If yes;  Date:  Time:    Call warm transferred to PDL: [x]  Yes  []  No    Call Received by Clinic Representative: Kim     If call not answered/not accepted, call received by Patient Services Representative:

## 2023-06-19 DIAGNOSIS — D721 Eosinophilia, unspecified type: Secondary | ICD-10-CM

## 2023-07-04 MED ORDER — AZELASTINE HCL 0.1 % NA SOLN
0 refills | Status: AC
Start: 2023-07-04 — End: ?

## 2023-07-05 MED ORDER — AZELASTINE HCL 0.1 % NA SOLN
1 refills | Status: AC
Start: 2023-07-05 — End: ?

## 2023-07-15 ENCOUNTER — Ambulatory Visit: Payer: BC Managed Care – HMO

## 2023-08-12 ENCOUNTER — Ambulatory Visit: Payer: BC Managed Care – HMO

## 2023-08-12 DIAGNOSIS — R1084 Generalized abdominal pain: Secondary | ICD-10-CM

## 2023-08-12 NOTE — Telephone Encounter
I spent a total of 5 minutes with this established patient cumulatively within the 7 days of the MyChart message date.   Topics of my discussion, including but not limited to evaluation/assessment/management of the patient complaint are noted in the MyChart message exchange(s).    Total time spent may include providing evaluation and advice, responding to the patient, documenting the encounter, placing orders, and reviewing relevant labs and/or prior notes, if applicable.     1. Generalized abdominal pain        Orders Placed This Encounter    CBC & Plt & Diff    Comprehensive Metabolic Panel    C-Reactive Protein    Lipase       Chelsea Potter  08/12/2023

## 2023-08-16 ENCOUNTER — Telehealth: Payer: BC Managed Care – HMO

## 2023-08-16 NOTE — Telephone Encounter
[facility] faxed that they have no records for the patient. Request for records sent to [facility]

## 2023-08-16 NOTE — Telephone Encounter
Per [facility] fax, they do not have any records on this patient. Message sent to pt.

## 2023-08-16 NOTE — Telephone Encounter
Results Request - The patient would like to discuss the results of their recent tests.     1) What type of test(s)? Labs     2) When was it performed? 11/4 and 08/13/23    3) Where was it performed? [place]    If [place], are results available in [facility]? Yes  If outside facility, what is their phone number?     Patient or caller has been notified of the turnaround time of 1-2 business day(s).

## 2023-08-20 NOTE — Progress Notes
Gastroenterology Consult   ATTENDING: Vernie Shanks, MD  PATIENT: Chelsea Potter  MRN: [id number]  DOB: 2001/01/01  DATE OF SERVICE: 09/12/2023  REFERRING PROVIDER: No ref. provider found  PRIMARY CARE PROVIDER: Ramiro Harvest., MD    REASON FOR REFERRAL:   No chief complaint on file.       Subjective:     Chief Complaint:  Chelsea Potter is a 22 y.o. female who presents for chronic digestive issues.     09/12/2023  ***    04/02/2023  For the last few weeks, pt reports exacerbation of sx. She woke up groggy and nauseous. Her diarrhea worsened. Of note, pt was training for a marathon and for 3 weeks was unable to train due to sx. 2 years ago, pt tried budesonide without improvement. Endorses stable weight at this time. Reports associated nausea and diarrhea. Denies weight loss.    12/27/2022  Bowel movements are generally normal. Minimal diarrhea. Ongoing episodic nausea. Even with avoidance of gluten. Labs reviewed. Colonoscopy results reviewed. Interval improvement in endoscopic findings. Of note different prep given for most recent examination.     07/2022  Tried budesonide for 3-4 weeks without interval change in symptoms. Discussed elimination diet but not very consistently. Working on dietary elimination of gluten. Celiac Ab testing was negative. Still with frequent defecation most days.     04/26/22  Ongoing diarrhea for the last 10 days.  Colonoscopy notable for poor preparation.  No change in symptoms following.  Ongoing diarrhea.  Baseline BM frequent 5-10 times per day.      Prior History   Progressive worsening over time and seems to worse since going to school. Symptoms of diarrhea, post meal nausea, and worsening symptoms following antibiotic therapy. Given a trial of FODMAP diet with partial improvement in symptoms. Frequent defecation.  Multiple bowel movements daily.  Maybe worse with gluten. Working out a lot. Currently on spring break, graduating from college this year. GF with colitis.     Past Medical & Surgical History:  PMH  Costochondritis    PSH  Sinus surgery    Relevant Family History:  (yes if checked)  []   Family history of colorectal cancer    []   Family history of GI cancer (not CRC)   []   Family history of IBD   []   Family history of celiac disease    Relevant Social History:    Social History     Tobacco Use    Smoking status: Never    Smokeless tobacco: Never   Substance Use Topics    Alcohol use: Yes     Alcohol/week: 4.2 oz     Types: 7 Glasses of wine per week    Drug use: Never             MEDICATIONS:     No outpatient medications have been marked as taking for the 09/12/23 encounter (Appointment) with Vernie Shanks, MD.         Allergies :    is allergic to amoxicillin, oseltamivir, cefdinir, and prednisone.    PHYSICAL EXAM      There were no vitals filed for this visit.         There is no height or weight on file to calculate BMI.    System Check if examined and normal Positive or additional negative findings   Constit  [x]  General appearance - normal     Eyes  []  Conjuctivae clear       HENMT  []   Normal Lips/teeth/gums, oropharynx, head     Neck  []  Inspection/palpation; thyroid normal      Resp  []  Effort good. Clear to auscultation bilaterally.     CV  [x]  Normal Rhythm/rate without murmur     Abdomen  [x]  Abdomen soft and NT, no rebound/guarding   [x]  Active BS 4 quadrants   []  No appreciable flank or shifting dullness   []  No hepatosplenomegaly   []  No umbilical hernia    Rectal   []  No external hemorrhoids   []  No masses palpated on DRE   []  Brown stool in vault   []   Appropriate resting and squeeze pressure   []   Appropriate bear down maneuver      MSK  []  Grossly normal strength, ROM     Neuro   []  Grossly normal      Psychiatric  []   Oriented to time, place and person   []   Appropriate insight/judgement & mood/affect                Lab Review:  Lab Results   Component Value Date    WBC 9.16 08/12/2023    HGB 14.8 08/12/2023    HCT 44.1 08/12/2023    MCV 92.3 08/12/2023 PLT 300 08/12/2023     Lab Results   Component Value Date    CREAT 0.94 08/12/2023    BUN 13 08/12/2023    NA 139 08/12/2023    K 4.0 08/12/2023    CL 100 08/12/2023    CO2 24 08/12/2023    ALT 20 08/12/2023    AST 26 08/12/2023    ALKPHOS 66 08/12/2023    BILITOT 1.6 (H) 08/12/2023    ALBUMIN 4.8 08/12/2023    LIPASE 50 08/12/2023     MR Enterography 01/17/2023  FINDINGS:     BOWEL FINDINGS: Normal bowel wall enhancement without wall thickening, stricture, or dilation. Normal appendix.     Perianal disease: None.      Additional bowel findings: None.     Extraintestinal findings: None.     ADDITIONAL FINDINGS:  Lower chest: Unremarkable.  Liver: Normal contour and signal intensity. No solid mass.  Gallbladder and bile ducts: Normally distended gallbladder with no gallstones or wall thickening. No biliary dilation.  Spleen: Normal in size.  Pancreas: Unremarkable.  Adrenals: Unremarkable.  Kidneys and ureters: Normal renal enhancement. No hydronephrosis.  Bladder: Unremarkable.  Reproductive organs: Normal uterus and ovaries. No adnexal masses.  Lymph nodes: Unremarkable.  Peritoneum: No free air, free fluid, or fluid collections.  Vessels: Unremarkable.  Abdominal wall: Unremarkable.  Bones: No suspicious osseous lesions.        IMPRESSION:     No evidence of active or chronic small bowel inflammation.    11/30/22 Colonoscopy and EGD  DIAGNOSIS:  Mild erythema and mucosal edema in the transverse colon, the remainder of the colonic mucosa was normal in appearance. Segmental biopsies were obtained. Interval normal appearance of the ileum and ileocecal valve. The terminal ileum was   intubated at least 15cm. Multiple forceps biopsies obtained using forceps.     RECOMMENDATIONS:  Await pathology results      DIAGNOSIS:  Normal esophagus.  No evidence of erosive esophagitis or Barrett's. Biopsies obtained from the distal and mid esophagus.  Normal stomach. Biopsies taken in antrum and mid body greater curvature for H pylori  Normal duodenum.  Biopsies obtained from the bulb and second portion.  CPT CODE:  16109 Colonoscopy, flexible; with biopsy, single or  multiple     A.  DUODENUM (BIOPSY):  - Duodenal mucosa with no histopathologic abnormality     B.  STOMACH (BIOPSY):  - Antral and oxyntic mucosa with no histopathologic abnormality  - No H. pylori organisms  - No intestinal metaplasia or dysplasia     C.  ESOPHAGUS, DISTAL (BIOPSY):  - Squamous mucosa with no histopathologic abnormality  - No intraepithelial eosinophils     D.  ESOPHAGUS, MID (BIOPSY):  - Squamous mucosa with no histopathologic abnormality  - No intraepithelial eosinophils     E.  TERMINAL ILEUM (BIOPSY):  - Increased eosinophils in the lamina propria (more than 90 per hpf)     F.  ILEOCECAL VALVE (BIOPSY):  - Increased eosinophils in the lamina propria (more than 90 per hpf)     G.  COLON, RIGHT (BIOPSY):  - Mild active colitis  - Mildly increased eosinophils (up to 35 per hpf)     H.  COLON, TRANSVERSE (BIOPSY):  - Mild active colitis     I.  COLON, LEFT (BIOPSY):  - Minimal active colitis  - Mildly increased eosinophils (up to 50 per hpf)     J.  RECTUM (BIOPSY):  - Focal active proctitis      COMMENT: The increased number of eosinophils is significant in the terminal ileum and ileocecal valve. Multiple foci of activity (cryptitis, not crypt abscess) are noted in the colonic biopsies. No granulomas or infectious agents are seen. There are no metaplastic changes or significant chronic inflammation or crypt architectural changes. Features could be compatible with the clinical impression of eosinophilic enteritis, although drug injury and other etiologies are not excluded.    03/23/22 Colonoscopy  FINDINGS:   The very distal terminal ileum had evidence of mild erythema the more proximal terminal ileum beyond 1-2 cm was normal in appearance to 10cm.  Forceps biopsies were obtained. The ileocecal valve itself was erythematous and with small aphthous ulcerations. Forceps biopsies obtained. The preparation of the colon itself was poor with adherent stool coating the majority of the right and transverse colon. The visible mucosa was normal in appearance. Biopsies obtained from the right and left   colon. Normal rectum biopsied using forceps.     A. SMALL INTESTINE, TERMINAL ILEUM (BIOPSY):  - Prominent eosinophils (>100 in a concentrated high power field)      B. ILEOCECAL VALVE (BIOPSY):  - Focal active inflammation  - Prominent eosinophils (>100 in a concentrated high power field)      C. LARGE INTESTINE, RANDOM RIGHT COLON (BIOPSY):  - No histopathologic abnormality  - No features of colitis     D. LARGE INTESTINE, RANDOM LEFT COLON (BIOPSY):  - No histopathologic abnormality  - No features of colitis     E. LARGE INTESTINE, RECTUM (BIOPSY):  - No histopathologic abnormality  - No features of proctitis     COMMENT: Lamina propria eosinophils are prominent in the biopsies from the terminal ileum and ileocecal valve, but do not appear increased in the biopsies from the colon and rectum. Focal active inflammation is also noted in the biopsy from the ileocecal valve with a few foci of cryptitis and a few crypt abscesses. No granuloma, pyloric metaplasia or infectious organisms are identified. The findings raise the possibility of eosinophilic enteritis in the appropriate clinical setting, and drug reaction needs to be ruled out. While Crohn disease is in the differential, the findings are insufficient for the diagnosis.      Calprotectin 03/21/2023  Calprotectin,  Fecal 47 <=49 ug/g Final       Medical Decision Making addressed on 09/12/2023:      Number and Complexity of Problems Addressed at the Encounter:   [x]   1 or more chronic illness with exacerbation, progression, or side effects of treatment  []   2 or more stable chronic illness  []   1 undiagnosed new problem with uncertain diagnosis  []   1 acute illness with systemic symptoms  []   1 acute complicated injury Review of Data: I have  [x]  Reviewed/ordered >= 3 unique laboratory, radiology, and/or diagnostic tests noted    []  Reviewed prior external notes and incorporated into patient assessment as noted  []  I have independently interpreted test performed by other physician(s) as noted   []  Discussed management or test interpretation with external provider(s) as noted       Risk of Complication and or Morbidity or Mortality of Patient Management including Social Determinants of Health:   []   I deem the above diagnoses to have a risk of complication, morbidity or mortality of Moderate   []  The diagnosis or treatment of said conditions is significantly limited by social determinants of health as noted.     Visit Diagnoses  / Problems addressed on 09/12/2023:     No diagnosis found.             Assessment & Plan :       Chronic diarrhea not clearly exacerbated by food or stress. 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 Consider early IBD but symptoms are chronic. Marked improvement in endoscopy with stable to worsening histology.   -Currently doing well, will trial Dupixent off-label if sx recur  - +/- stool testing depending on duration of time between symptom exacerbations  -Labs reviewed and normal.     ***    The above recommendation were discussed with the patient. The patient has all questions answered satisfactorily and is in agreement with this recommended plan of care.    SCRIBE SIGNATURE(S):  I, Lewie Chamber, am scribing for, and in the presence of Dr. Vernie Shanks.    PHYSICIAN SIGNATURE(S):  I, Dr. Vernie Shanks, personally performed the services described in this documentation, as scribed by Lewie Chamber in my presence, and it is both accurate and complete.

## 2023-08-20 NOTE — Addendum Note
Addended by: Vernie Shanks on: 08/20/2023 07:14 AM     Modules accepted: Orders

## 2023-08-22 ENCOUNTER — Inpatient Hospital Stay: Payer: BC Managed Care – HMO | Attending: Gastroenterology

## 2023-08-22 DIAGNOSIS — R1084 Generalized abdominal pain: Secondary | ICD-10-CM

## 2023-09-12 ENCOUNTER — Telehealth: Payer: BLUE CROSS/BLUE SHIELD

## 2023-09-12 ENCOUNTER — Ambulatory Visit: Payer: BLUE CROSS/BLUE SHIELD

## 2023-09-12 ENCOUNTER — Ambulatory Visit: Payer: BLUE CROSS/BLUE SHIELD | Attending: Gastroenterology

## 2023-09-12 ENCOUNTER — Inpatient Hospital Stay: Payer: BLUE CROSS/BLUE SHIELD | Attending: Gastroenterology

## 2023-09-12 MED ORDER — CLENPIQ 10-3.5-12 MG-GM -GM/160ML PO SOLN
0 refills | Status: AC
Start: 2023-09-12 — End: ?

## 2023-09-12 NOTE — Telephone Encounter
Hold placed, please schedule patient:    PROVIDER:  Dr. Lucianne Muss            PROCEDURE: Colonoscopy   SEDATION:             MAC  LOCATION:  [facility]  ADDRESS:            [address]; [address]; [place], [place] [address]  DATE:             12/13/2023                             Arrival Time:             6:00 am                Procedure Time:        7:00 am

## 2023-09-12 NOTE — Telephone Encounter
Reply by: Jonelle Sports    Procedure scheduled.

## 2023-10-24 MED ORDER — AZELASTINE HCL 0.1 % NA SOLN
1 refills
Start: 2023-10-24 — End: ?

## 2023-10-25 MED ORDER — AZELASTINE HCL 0.1 % NA SOLN
2 refills
Start: 2023-10-25 — End: ?

## 2023-10-29 MED ORDER — AZELASTINE HCL 0.1 % NA SOLN
2 refills | Status: AC
Start: 2023-10-29 — End: ?

## 2023-11-28 MED ORDER — DUPIXENT 300 MG/2ML SC SOAJ
5 refills | Status: AC
Start: 2023-11-28 — End: ?

## 2023-12-06 ENCOUNTER — Telehealth: Payer: BLUE CROSS/BLUE SHIELD

## 2023-12-06 NOTE — Telephone Encounter
 Confirmation Documentation   Patient is scheduled on (DATE) for:  3/7  7   [x]  Colonoscopy   []  Upper Endoscopy   []  Pouchoscopy   []  Upper Endoscopy w/Bravo   []  Upper Endoscopy w/ Esophageal Manometry   []  Upper Endoscopy w/ Esophageal Manometry & pH   []  Upper Endoscopy w/Endoflip   []  Sigmoidoscopy   []  Anoscopy   []  Illeoscopy   []  Small Bowel Enteroscopy   []  Esophageal Manometry   []  Anorectal Manometry   []  pH Study   []  Capsule Endoscopy    Informed patient of the following:   [x]  Arrival time   [x]  Location including suite number   [x]  Transportation   [x]  Mac Script   [x]  Does patient have prep instructions?   [x]  Informed patient to call back should there be any questions?   [x]  Does procedure order match the case scheduled    NOTE: If patients have any questions regarding prescribed medication patient needs to contact their prescribing physician to ensure it is ok to stop medications.

## 2023-12-10 ENCOUNTER — Other Ambulatory Visit: Payer: BLUE CROSS/BLUE SHIELD

## 2023-12-10 ENCOUNTER — Telehealth: Payer: BLUE CROSS/BLUE SHIELD

## 2023-12-10 NOTE — Telephone Encounter
 Sw patient to remind and confirm procedure scheduled for 12/13/23  Check-in time: 6:15 am  Procedure time: 7:00 am   Advised patient to take any blood pressure, heart, anti-seizurals and inhaler medications that morning of the procedure. Patient advised if using an inhaler to bring to the procedure. Advised patient they must have a ride home from the procedure with someone that can accompany them home. Medical procedure staff must be able to confirm ride when they arrive or the procedure will be cancelled.    Advised to call back with any questions or concerns.

## 2023-12-12 ENCOUNTER — Other Ambulatory Visit: Payer: BC Managed Care – HMO

## 2023-12-12 ENCOUNTER — Telehealth: Payer: BC Managed Care – HMO

## 2023-12-12 NOTE — Telephone Encounter
 Sw  patient to remind and confirm procedure scheduled for 12/13/2023  Check-in time: 9:00 am   Procedure time:10:00 am   Advised patient to take any blood pressure, heart, anti-seizurals and inhaler medications that morning of the procedure. Patient advised if using an inhaler to bring to the procedure. Advised patient they must have a ride home from the procedure with someone that can accompany them home. Medical procedure staff must be able to confirm ride when they arrive or the procedure will be cancelled.    Advised to call back with any questions or concerns.

## 2023-12-12 NOTE — H&P
 Indication for Procedure: Eosinophilic enteritis    Level of sedation intended for procedure: moderate, deep, MAC  []  Moderate []  Deep [x]  MAC []  Anesthesia   Prior sedation problems []  Yes [x]  No  If yes explain:  Teeth: [x]  Intact []  Loose []  Missing []  Dentures []  Bridges  [place] Visualized: [x]  Yes []  Partially []  not at all  Neck ROM: [x]   Normal []  Limited  Past Medical History:  No past medical history on file.    Past Surgical History:  No past surgical history on file.    Family History:  No family history on file.     Social History:  Social History     Socioeconomic History    Marital status: Single   Tobacco Use    Smoking status: Never    Smokeless tobacco: Never   Substance and Sexual Activity    Alcohol use: Yes     Alcohol/week: 4.2 oz     Types: 7 Glasses of wine per week    Drug use: Never       Allergies:  Allergies   Allergen Reactions    Amoxicillin Hives and Itching    Oseltamivir     Cefdinir Hives, Itching and Rash    Prednisone Hives, Itching and Rash       Current Medications:  No current facility-administered medications for this encounter.     Current Outpatient Medications   Medication Sig    azelastine 137 mcg/spray nasal spray SPRAY 1 SPRAY IN EACH NOSTRIL TWO (2) TIMES DAILY.    dupilumab (DUPIXENT) 300 MG/2ML SOAJ injection pen INJECT 1 PEN UNDER THE SKIN EVERY 7 DAYS    EPINEPHrine (EPIPEN JR) 0.15 mg/0.3 mL injection Inject 0.3 mLs (0.15 mg total) into the muscle as needed for for Anaphylaxis.    Loratadine (CLARITIN PO) Take by mouth.    Sod Picosulfate-Mag Ox-Cit Acd (CLENPIQ) 10-3.5-12 MG-GM -GM/160ML SOLN Dispense 1 kit, take as directed    sodium sulfate-potassium sulfate-magnesium sulfate (SUPREP BOWEL PREP KIT) solution Take as directed by your doctor for colonoscopy.       Physical Exam:    Vitals Signs: There were no vitals taken for this visit.  Constitutional: Patient appears very comfortable.    HEENT: normal exam  Cardiovascular: Heart sounds are normal.  There no murmurs or gallops.  There is no irregular heartbeat.  Respiratory: Patient is not short of breath.  There is no wheezing or rales.  Lungs are clear to auscultation.  Gastrointestinal: Abdomen is soft and nontender.  Bowel sounds are present.   No masses noted.   Musculoskeletal: There is no edema, cyanosis or clubbing.    Neurological: Patient is awake alert and fully oriented.   Assessment:  Chelsea Potter is a 23 y.o.female who is brought to the endoscopy suite for the procedure because of the following:    Eosinophilic enteritis        The procedure and its possible complications including but not limited to bleeding and perforation and their treatment including but not limited to blood transfusion and even surgery was once again explained to, and understood by the patient. Patient agrees to proceed with the procedure.    ASA Classification:  2    I have reviewed the history and physical and have determined Chelsea Potter to be an appropriate candidate and medically stable to undergo the procedure with planned sedation and analgesia.    Patient cleared for discharge once discharge criteria is met

## 2023-12-12 NOTE — Discharge Instructions
 PROCEDURE: []  Upper endoscopy (EGD)   [x]  Colonoscopy    []  Sigmoidoscopy      ACTIVITY:    [x]  Avoid any strenuous physical activity for 24 hours    [x]  No driving or operating heavy machinery for 24 hours    [x]  No alcoholic beverages for 24 hours (may interact with some medications you received during your procedure)   [x]  May use a heating pad on low temperature for any abdominal cramping.      DIET:    [x]  Resume your usual diet    []  Specific diet instructions:     Call Dr. Remus Blake office [phone] if you experience any of the following:      [x]  Severe chest pain, difficulty breathing    [x]  Passage of blood clots, black tarry stools, vomiting blood    [x]  Severe inflammation at the I.V. site    [x]  Difficulty swallowing or persistent vomiting    [x]  Abdominal pain    [x]  Chills or fever greater than 101 F or 38.4 C within 24 hours of procedure    FOLLOW-UP CARE:        [x]   Call Dr. Lucianne Muss [phone] for appointment as needed   []   Call  Dr. [phone]  for appointment in one month   []   Call your primary MD for appointment as needed   []   Call/Message Dr. Lucianne Muss [phone]  for biopsy results in one week   [x]  No  NSAIDs for the next 7 days.   [x]  Ok to resume all home medications.

## 2023-12-13 MED ADMIN — PROPOFOL 200 MG/20ML IV EMUL: INTRAVENOUS | @ 18:00:00 | Stop: 2023-12-13 | NDC 63323026929

## 2023-12-13 MED ADMIN — LIDOCAINE HCL (PF) 1 % IJ SOLN: INTRAVENOUS | @ 18:00:00 | Stop: 2023-12-13 | NDC 63323049257

## 2023-12-13 MED ADMIN — SODIUM CHLORIDE 0.9 % IV SOLN: INTRAVENOUS | @ 18:00:00 | Stop: 2023-12-13 | NDC 00338004904

## 2023-12-13 MED ADMIN — PROPOFOL 200 MG/20ML IV EMUL (ANES): INTRAVENOUS | @ 18:00:00 | Stop: 2023-12-13

## 2023-12-13 NOTE — Procedures
 PATIENT NAME: Chelsea Potter  DATE OF BIRTH: 06/30/2001  RECORD NUMBER: [id number]  DATE/TIME OF PROCEDURE: 12/13/2023 / 10:00 AM  ENDOSCOPIST: Vernie Shanks, MD  REFERRING PHYSICIAN:  FELLOW:    INDICATIONS FOR EXAMINATION: Eosinophilic Enteritis  PROCEDURE PERFORMED: COLONOSCOPY - cold biopsy    MEDICATIONS: MAC Anesthesia    PROCEDURE TECHNIQUE: Patient's medications, allergies, past medical, surgical, social and family histories were reviewed and updated as appropriate. A discussion of informed consent was had with the patient and/or the patient's family prior to the   procedure, including sedation. The alternatives, benefits and risks of the procedure including but not limited to perforation, hemorrhage, infection, adverse drug reaction and aspiration were discussed    Description of Procedure:  After discussion of informed consent, and appropriate level of sedation were attained, the patient was placed in the left lateral position. The patient was monitored continuously with pulse oximetry, blood pressure monitoring, and direct observations.    After digital rectal examination, the colonoscope PCF-H190DL [id number] was inserted into the rectum and advanced under direct vision to the level of the terminal ileum. The quality of the colonic preparation was Good. A careful inspection was made as the   colonoscope was withdrawn. Findings and interventions are described below.?  TOTAL WITHDRAWAL TIME: 00:07?  TOTAL INSERTION TIME: 00:11    EXTENT OF EXAM: terminal ileum      INSTRUMENTS: PCF-[id number] #[id number]  TECHNICAL DIFFICULTY: No  LIMITATIONS: None  TOLERANCE: Good  VISUALIZATION: Good    FINDINGS: Normal ileal and colonic mucosa. No evidence of erythema. Segmental biopsies obtained from the terminal ileum, transverse colon, left colon and rectum    ESTIMATED BLOOD LOSS: None    DIAGNOSIS: Normal ileal and colonic mucosa. No evidence of erythema. Segmental biopsies obtained from the terminal ileum, transverse colon, left colon and rectum    RECOMMENDATIONS: Await pathology results    CPT CODE:  95621 Colonoscopy, flexible; with biopsy, single or multiple          This electronic signature authenticates all electronic and/or handwritten documentation, including orders, generated by the signer during the episode of care contained in this record.12/13/2023 10:26 AM by Vernie Shanks, MD

## 2023-12-20 ENCOUNTER — Telehealth: Payer: BC Managed Care – HMO

## 2023-12-20 LAB — Tissue Exam

## 2023-12-20 NOTE — Telephone Encounter
 Results Request - The patient would like to discuss the results of their recent tests.     1) What type of test(s)? colonoscopy    2) When was it performed? 12/13/23    3) Where was it performed? [facility]    If [place], are results available in [facility]? yes  If outside facility, what is their phone number?     Patient or caller has been notified of the turnaround time of 1-2 business day(s).         Patient is asking to have the results released to mychart.

## 2023-12-23 NOTE — Telephone Encounter
 Spoke with patient, modest improvement in biopsies. Continue dupixent

## 2023-12-29 ENCOUNTER — Other Ambulatory Visit: Payer: BLUE CROSS/BLUE SHIELD

## 2024-01-13 ENCOUNTER — Other Ambulatory Visit: Payer: BC Managed Care – HMO

## 2024-02-21 MED ORDER — AZELASTINE HCL 0.1 % NA SOLN
2 refills
Start: 2024-02-21 — End: ?

## 2024-02-24 MED ORDER — AZELASTINE HCL 0.1 % NA SOLN
NASAL | 2 refills | 30.00000 days | Status: AC
Start: 2024-02-24 — End: ?

## 2024-03-10 ENCOUNTER — Ambulatory Visit: Payer: BLUE CROSS/BLUE SHIELD | Attending: Student in an Organized Health Care Education/Training Program

## 2024-03-31 ENCOUNTER — Ambulatory Visit: Payer: BC Managed Care – HMO | Attending: Student in an Organized Health Care Education/Training Program

## 2024-04-08 MED ORDER — DUPIXENT 300 MG/2ML SC SOAJ
5 refills
Start: 2024-04-08 — End: ?

## 2024-04-09 ENCOUNTER — Telehealth: Payer: BLUE CROSS/BLUE SHIELD

## 2024-04-09 MED ORDER — DUPIXENT 300 MG/2ML SC SOAJ
300 mg | SUBCUTANEOUS | 5 refills | 28.00000 days | Status: AC
Start: 2024-04-09 — End: 2024-04-13

## 2024-04-09 NOTE — Telephone Encounter
 Call Back Request      Reason for call back: [facility] is calling in to let MD know they do not dispense dupixent  at their pharmacy. There was an rx sent in today.     Any Symptoms:  []  Yes  [x]  No      If yes, what symptoms are you experiencing:    Duration of symptoms (how long):    Have you taken medication for symptoms (OTC or Rx):      If call was taken outside of clinic hours:    [] Patient or caller has been notified that this message was sent outside of normal clinic hours.     [] Patient or caller has been warm transferred to the physician's answering service. If applicable, patient or caller informed to please call us  back if symptoms progress.  Patient or caller has been notified of the turnaround time of 1-2 business day(s).

## 2024-04-13 DIAGNOSIS — K5281 Eosinophilic gastritis or gastroenteritis: Secondary | ICD-10-CM

## 2024-04-13 MED ORDER — DUPIXENT 300 MG/2ML SC SOAJ
300 mg | SUBCUTANEOUS | 5 refills
Start: 2024-04-13 — End: ?

## 2024-04-13 MED ORDER — DUPIXENT 300 MG/2ML SC SOAJ
300 mg | SUBCUTANEOUS | 5 refills | 28.00000 days | Status: AC
Start: 2024-04-13 — End: ?

## 2024-04-15 ENCOUNTER — Ambulatory Visit
Payer: Commercial Managed Care - Pharmacy Benefit Manager | Attending: Student in an Organized Health Care Education/Training Program

## 2024-04-15 DIAGNOSIS — S0300XS Dislocation of jaw, unspecified side, sequela: Secondary | ICD-10-CM

## 2024-04-15 DIAGNOSIS — Z124 Encounter for screening for malignant neoplasm of cervix: Secondary | ICD-10-CM

## 2024-04-15 DIAGNOSIS — Z020 Encounter for examination for admission to educational institution: Secondary | ICD-10-CM

## 2024-04-15 DIAGNOSIS — Z0000 Encounter for general adult medical examination without abnormal findings: Principal | ICD-10-CM

## 2024-04-15 DIAGNOSIS — M1990 Unspecified osteoarthritis, unspecified site: Secondary | ICD-10-CM

## 2024-04-15 NOTE — Progress Notes
 [facility]  Outpatient Note  PMD: Vicci Sonny LABOR., MD  04/15/2024    SUBJECTIVE:  Chelsea Potter is a 23 y.o. female who presents for CPE  No chief complaint on file.      #TMJ pain  #ear pain   Intermittent X 6 month. Worsened by diving, rhinitis. Does not seem to be related to/affect ability for chewing.  Saw outside ENT. Given short-term steroids, which did help.     #s/p colonoscopy  # eosinophilic enteritis     F/b GI Nayeb-Hashemi, Hamed, MD  On dupixent    No sx changes   Avoiding gluten, diary,        #sternal arthritis   [place]um [place], now with a private office. Getting genetic workup   Will see them tomorrow.  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    Allergies: Amoxicillin, Oseltamivir, Cefdinir, and Prednisone  Medications:   No LMP recorded.    No past medical history on file.  No past surgical history on file.  Immunization History   Administered Date(s) Administered    COVID-19 [employer] Fall 2023 12y and up) PF, 30 mcg/0.3 mL 09/06/2022    COVID-19, mRNA, (Pfizer - Purple Cap) 30 mcg/0.3 mL 12/12/2019, 01/02/2020, 09/21/2020    COVID-19, mRNA, bivalent (Pfizer) 30 mcg/0.3 mL (12y and up) 09/25/2021    Hepatitis B, unspecified formulation 08/14/1999, 10/31/1999, 04/04/2000    Influenza vaccine IM quadrivalent (Afluria Quad) (PF) SYR (5 years of age and older) 06/15/2022    MMR 08/06/2000, 07/24/2004, 07/03/2018    Tdap 09/25/2019    influenza, unspecified formulation 07/21/2020, 06/15/2022    varicella vaccine (Varivax) 08/07/2000, 08/07/2005     Health Maintenance   Topic Date Due    Syphilis Screening  Never done    Cervical Ca Screening: PAP Smear  Never done    COVID-19 Vaccine(Tracks primary and booster doses, not sup/immunocomp) (6 - 2024-25 season) 06/09/2023    Tdap/Td Vaccine (2 - Td or Tdap) 12/08/2029 (Originally 09/24/2029)    Influenza Vaccine (1) 06/08/2024    Hepatitis B Screening  Discontinued    HPV Vaccines  Discontinued    Hepatitis C Screening  Discontinued    Chlamydia/GC Screening  Discontinued    HIV Screening  Discontinued     No family history on file.  Social History     Social History Narrative    Not on file     Review of Systems -   A complete 14-system review of system was performed with pertinent positives and negatives included above and in the HPI and otherwise reviewed and found to be negative and/or non-contributory.    OBJECTIVE:  There were no vitals taken for this visit.  General appearance - Alert, non-toxic, well appearing, and in no distress.    Eyes - Pupils  are equally round and reactive to light, extra ocular movements are intact, conjunctiva are not injected.    Ears - External ear normal, canal normal with no debris, tympanic membrane translucent without erythema or deformity.    Nose - External nose normal in appearance.     Mouth - Mucous membranes moist, pharynx normal without lesions, uvula midline.    Respiratory - B/L vesicular breath sounds    Cardiovascular - no murmurs appreciated    Abdomen - Bowel sounds present. Soft, nontender, nondistended, no masses or hepatosplenomegaly.    GU:    GU: Normal appearing external genitalia, cervix clearly visualized and appears smooth with no visible lesions or erosions, vaginal mucosa pink and well rugated  Bimanual exam: Nontender, no masses palpated  Skin: Normal vulva, perineum        Back exam - No deformity of spine.   Musculoskeletal - Full range of motion in neck, shoulders, hips and knees. Extremities - No peripheral edema. Warm and well perfused,  No clubbing of digits.    Skin - mild skin scaling over face (per pt, d/t recent sunburn)     Neurological - Alert, oriented. Strength and sensation in upper and lower extremities grossly normal.  Normal speech, normal gait.    Psychiatric - Appearance: well dressed, well groomed. Behavior: sits comfortably, answers questions, appropriate eye contact. Attitude: Cooperative. Level of Consciousness: awake and alert. Orientation: Deferred. Speech and Language: Normal rate, rhythm and prosody. Mood: euthymic. Affect: euthymic and consistent with mood. Thought Process/Form: Linear and goal-oriented. Thought Content: As per HPI.  Suicidality and Homicidality: No SI/HI. Insight and Judgment: Fair and appropriate.   Attention Span: Normal. Memory: No obvious deficits. Intellectual Functioning: Within normal limits.    Labs:  Results for orders placed or performed during the hospital encounter of 12/13/23   POCT urine pregnancy   Result Value Ref Range    Preg Test, Ur, Manual Negative    Tissue Exam/Foreign Body (AP)   Result Value Ref Range    CASE REPORT       Surgical Pathology Report                         Case: [id number]                                Authorizing Provider:  Philemon Hoyer, MD   Collected:           12/13/2023 1014              Ordering Location:     [facility]     Received:            12/13/2023 1842                                     Digestive Diseases                                                                                  Procedure Unit  Pathologist:           Cherilyn Sawyer, MD                                                        Specimens:   A) - Terminal Ileum, Terminal ileum biopsies                                                        B) - Colon, right colon biopsies                                                                    C) - Transverse Colon, transverse colon biopsies                                                    D) - Colon, left colon biopsies                                                                      E) - Rectum, rectum biopsies                                                               CLINICAL INFORMATION       History of eosinophilic enteritis, on dupixent , presenting for colonoscopy to assess for mucosal healing.       FINAL DIAGNOSIS         A. TERMINAL ILEUM (BIOPSY):  - Ileal mucosa with rare lamina propria neutrophils   - Focally increased lamina propria eosinophils (up to 75 in a concentrated high-power field)    B. COLON, RIGHT (BIOPSY):  - Colonic mucosa with focally increased lamina propria eosinophils (up to 70 in a concentrated high-power field) and rare crypt branching    C. COLON, TRANSVERSE (BIOPSY):  - Colonic mucosa with no histopathologic abnormality     D. COLON, LEFT (BIOPSY):  - Colonic mucosa with no histopathologic abnormality     E. RECTUM (BIOPSY):  - Rectal mucosa with no histopathologic abnormality    COMMENT:  Biopsies from the terminal ileum and right colon show increased lamina propria eosinophils. Additionally, there are areas with eosinophilic degranulation and a rare eosinophilic crypt abscess (Part B). Features of chronic mucosal injury are not prominent. No granulomas or infectious organisms are seen. The findings may  be compatible with the clinical impression of eosinophilic enteritis, although drug injury and other etiologies should be excluded.     Dr. Donald Purdue has reviewed select slides (Parts A and B) from this case and concurs with the above diagnosis.      MICROSCOPIC DESCRIPTION       A microscopic examination has been performed.      [facility] REPORT       GENERAL IMMUNOHISTOCHEMISTRY REPORT    BLOCK:  D1     FIXATIVE:  Formalin     ANTIBODY/PROBE RESULT (IN CELLS OF INTEREST)/COMMENT  Spirochete  Negative    INTERPRETATION:    Please see final diagnosis    Note: The immunoperoxidase stain reported above was developed and its performance characteristics determined by [facility].  It has not been cleared or approved by the U.S. Food and Drug Administration, although such approval is not required for analyte-specific reagents of this type.  Appropriate positive and negative controls are included for each case.       GROSS DESCRIPTION       The specimen is received in five formalin-filled containers, each labeled with the patient's name Chelsea, Potter), medical record number [id number]), and associated designation.     Part A, is designated ?terminal ileum biopsies?, five fragments, 0.3-0.6 cm, submitted in a mesh bag, A1.     Part B, is designated ?right colon biopsies?, five fragments, 0.4-0.6 cm, submitted in a mesh bag, B1.     Part C, is designated ?transverse colon biopsies?, two fragments, 0.3 cm and 0.4 cm, submitted in a mesh bag, C1.     Part D, is designated ?left colon biopsies?, five fragments, 0.2-0.4 cm, submitted in a mesh bag, D1.     Part E, is designated ?rectum biopsies?, two fragments, each 0.3 cm, submitted in a mesh bag, E1.     JL 12/13/2023       Signatures       I certify that I personally conducted a gross and/or microscopic examination(s) of the described specimen(s), and have reviewed the interpretation of this test and diagnosis(es).  I agree with the documented findings or edited the findings as necessary.      EMBEDDED IMAGES       Imaging:  No results found.    ASSESSMENT and PLAN:  Avrey Flanagin is a 23 y.o. female who presents for No chief complaint on file.    No diagnosis found.  New or Modified Medications for this Encounter   New    No medications on file   Modified    No medications on file   Discontinued Medications    No medications on file     .  1. Routine health maintenance  Comprehensive Metabolic Panel    Lipid Panel    Hgb A1c    TSH with reflex FT4, FT3    Vitamin D,25-Hydroxy    CBC & Plt & Diff CANCELED: CBC      2. School physical exam  Rubella Ab Immune Status    Measles Ab Immune Status    Hep B Surface Ab Quant    MTB-Quantiferon-Gold Plus ELISA    Mumps Ab Immune Status    VZV Ab Immune Status    Hep A Ab,Total      3. Pap smear for cervical cancer screening  Pap smear 30+ yo (with HPV), Thinprep    Pap smear 30+ yo (with HPV), Thinprep      4.  Eosinophilic enteritis        5. Arthritis        6. Dislocation of temporomandibular joint, sequela                # Routine health maintenance (Primary)  S/p PAP smear today     - Comprehensive Metabolic Panel; Future  - Lipid Panel; Future  - Hgb A1c; Future  - TSH with reflex FT4, FT3; Future  - Vitamin D,25-Hydroxy; Future  - CBC & Plt & Diff; Future    # School physical exam    - Rubella Ab Immune Status; Future  - Measles Ab Immune Status; Future  - Hep B Surface Ab Quant; Future  - MTB-Quantiferon-Gold Plus ELISA; Future  - Mumps Ab Immune Status; Future  - VZV Ab Immune Status; Future  - Hep A Ab,Total; Future    #  Pap smear for cervical cancer screening    - Pap smear 30+ yo (with HPV), Thinprep; Future  - Pap smear 30+ yo (with HPV), Thinprep    #TMJ pain  #ear pain    Stable, followed by ENT    # eosinophilic enteritis   Stable, followed by GI    #sternal arthritis   Stable, followed by rheum    #seasonable allergies  stable      Number and Complexity of Problems Addressed at the Encounter:   Level 4:   []   1 or more chronic illness with exacerbation, progression, or side effects of treatment  [x]   2 or more stable chronic illness  []   1 undiagnosed new problem with uncertain diagnosis  []   1 acute illness with systemic symptoms  []   1 acute complicated injury   Level 5:   []   1 or more chronic illness with severe exacerbation, progression, or side effects of treatment  []   1 acute or chronic illness or injury that poses a threat to life or bodily function     Review of Data: I have (select 1 out of 3 categories for level 4; 2 out of 3 categories for level 5)  []  Reviewed/ordered []  1 []  2 [x]  >= 3 unique laboratory, radiology, and/or diagnostic tests noted below   []  I have reviewed tests, documents or independent historian(s):  []  Reviewed/ordered >= 3 unique laboratory, radiology, and/or diagnostic tests noted    []  Reviewed prior external notes and incorporated into patient assessment as noted  []  I have independently interpreted test performed by other physician(s) as noted   []  Discussed management or test interpretation with external provider(s) as noted       Risk of Complication and or Morbidity or Mortality of Patient Management including Social Determinants of Health:   []   I deem the above diagnoses to have a risk of complication, morbidity or mortality of []  Minimal   []  Low (3)     [x]  Moderate (4)   []  Severe (5)  []  The diagnosis or treatment of said conditions is significantly limited by social determinants of health as noted.       30 min were spent on this patient visit included physician face-to-face time, preparing to see the patient, counseling and educating patient/family/caregiver, ordering medications/tests/procedures, referring and communicating with other healthcare professionals, documenting clinical information in the EHR and independently interpreting results and communicating results to patient/family/caregiver.      New patient: 99203 30-44 min, 00795 45-59 min, 00794 60-74 min  Established patient: 99213 20-29 min, 00785 30-39 min, 00784 40-55 min  The above plan/recommendation(s) were discussed with the patient. The patient had all questions answered satisfactorily and is in agreement with this recommended plan of care.    # Follow-up in 1 year or PRN if above symptoms do not resolve or worsen.  Future Appointments   Date Time Provider Department Center   04/15/2024 10:00 AM Vicci Sonny LABOR., MD [facility] [place]/         Signed,  Lyna Campi MS4  D/w Sonny Vicci MD  Clinical Instructor  Department of Medicine 9:58 AM 04/15/2024         I was physically present with the medical student and I performed all key elements of history, exam, and medical decision making in the care of patient and I agree with plan above.  I have verified the documentation above as accurate with my edits and additions.   Dr. Vicci          This note was authored by medical student, Lyna Campi, at [facility]. This note should not be consulted for clinical, billing, or medico-legal purposes unless a physician has reviewed this note and added an attestation below regarding the accuracy of above documentation.

## 2024-04-16 ENCOUNTER — Other Ambulatory Visit: Payer: BLUE CROSS/BLUE SHIELD

## 2024-04-17 ENCOUNTER — Other Ambulatory Visit: Payer: BC Managed Care – HMO

## 2024-04-17 ENCOUNTER — Ambulatory Visit: Payer: BC Managed Care – HMO

## 2024-04-17 ENCOUNTER — Telehealth: Payer: Commercial Managed Care - Pharmacy Benefit Manager

## 2024-04-17 LAB — HPV DNA PCR: OTHER HIGH RISK HPV: NEGATIVE

## 2024-04-17 NOTE — Telephone Encounter
 I have Sent the PA to The the [facility] is [id number]

## 2024-04-21 ENCOUNTER — Telehealth: Payer: BLUE CROSS/BLUE SHIELD

## 2024-04-21 NOTE — Telephone Encounter
 Prior Auth pending for Dupixent  Syn 300

## 2024-04-21 NOTE — Telephone Encounter
 Medication Prior Authorization    Name of medication:   P. [id number]    Prescribed date: 7.7.25    Is there an alternative covered medication?     Has patient taken any other medications for this matter?     Insurance company phone number:     [employer] number:     Patient or caller has been notified of the turnaround time of 1-2 business day(s).

## 2024-04-21 NOTE — Telephone Encounter
 [employer] [employer]: [phone], insurance rep [place].   F: [phone]  Case ID: [id number], faxed in chart notes

## 2024-04-22 ENCOUNTER — Ambulatory Visit: Payer: BC Managed Care – HMO | Attending: Student in an Organized Health Care Education/Training Program

## 2024-04-22 ENCOUNTER — Other Ambulatory Visit: Payer: BLUE CROSS/BLUE SHIELD

## 2024-04-22 ENCOUNTER — Other Ambulatory Visit: Payer: Commercial Managed Care - Pharmacy Benefit Manager

## 2024-04-30 ENCOUNTER — Other Ambulatory Visit: Payer: Commercial Managed Care - Pharmacy Benefit Manager

## 2024-04-30 LAB — Liquid-based pap smear: LAB AP GYN INTERPRETATION: NEGATIVE

## 2024-05-01 ENCOUNTER — Other Ambulatory Visit: Payer: BLUE CROSS/BLUE SHIELD

## 2024-05-05 ENCOUNTER — Telehealth: Payer: Commercial Managed Care - Pharmacy Benefit Manager

## 2024-05-05 NOTE — Telephone Encounter
 Pt came today to pick up school forms, Nefriz had them ready for pick up on her desk.

## 2024-05-05 NOTE — Telephone Encounter
Form is on my desk

## 2024-05-07 ENCOUNTER — Other Ambulatory Visit: Payer: Commercial Managed Care - Pharmacy Benefit Manager

## 2024-05-07 MED ORDER — AZELASTINE HCL 0.1 % NA SOLN
2 refills
Start: 2024-05-07 — End: ?

## 2024-05-08 MED ORDER — AZELASTINE HCL 0.1 % NA SOLN
2 refills
Start: 2024-05-08 — End: ?

## 2024-05-12 ENCOUNTER — Telehealth: Payer: BC Managed Care – HMO

## 2024-05-12 NOTE — Telephone Encounter
 She likely received prior to starting college. Please obtain vaccine records.

## 2024-05-12 NOTE — Telephone Encounter
 Orders Request    What is being requested? (Tests, Labs, Imaging, etc.): Vaccine for Meninggococcal     Reason for the request: Patient needs vaccine for school please, if possible asap since she needs by this week Friday however sooner the better     Where does the patient want to be seen?  [place]     If outside [place], what is the fax number to the facility?      Has the patient seen their doctor for this matter? N/a     Last office visit: 04/15/2024    Patient or caller was offered an appointment but declined.    Patient or caller has been notified of the turnaround time of 1-2 business day(s).

## 2024-05-13 ENCOUNTER — Ambulatory Visit: Payer: BC Managed Care – HMO

## 2024-05-13 ENCOUNTER — Other Ambulatory Visit: Payer: Commercial Managed Care - Pharmacy Benefit Manager

## 2024-05-13 ENCOUNTER — Telehealth: Payer: BC Managed Care – HMO

## 2024-05-13 DIAGNOSIS — Z020 Encounter for examination for admission to educational institution: Principal | ICD-10-CM

## 2024-05-13 NOTE — Telephone Encounter
 Call Back Request      Reason for call back: Pt calling to inquire about request for vaccine for school, never heard back, I do see comments  . I tried reaching out on teams no reply.     Please call pt to advise     Thank you     Any Symptoms:  []  Yes  [x]  No      If yes, what symptoms are you experiencing:    Duration of symptoms (how long):    Have you taken medication for symptoms (OTC or Rx):      If call was taken outside of clinic hours:    [] Patient or caller has been notified that this message was sent outside of normal clinic hours.     [] Patient or caller has been warm transferred to the physician's answering service. If applicable, patient or caller informed to please call us  back if symptoms progress.  Patient or caller has been notified of the turnaround time of 1-2 business day(s).

## 2024-05-13 NOTE — Telephone Encounter
 Note  Call Back Request        Reason for call back: Pt calling to inquire about request for vaccine for school, never heard back, I do see comments  . I tried reaching out on teams no reply.      Please call pt to advise      Thank you      Any Symptoms:  []  Yes  [x]  No       If yes, what symptoms are you experiencing:    Duration of symptoms (how long):    Have you taken medication for symptoms (OTC or Rx):       If call was taken outside of clinic hours:    [] Patient or caller has been notified that this message was sent outside of normal clinic hours.      [] Patient or caller has been warm transferred to the physician's answering service. If applicable, patient or caller informed to please call us  back if symptoms progress.  Patient or caller has been notified of the turnaround time of 1-2 business day(s).

## 2024-06-10 MED ORDER — DUPIXENT 300 MG/2ML SC SOAJ
300 mg | SUBCUTANEOUS | 3 refills | 28.00000 days | Status: AC
Start: 2024-06-10 — End: ?

## 2024-10-06 ENCOUNTER — Other Ambulatory Visit: Payer: Commercial Managed Care - Pharmacy Benefit Manager

## 2024-10-06 DIAGNOSIS — K5281 Eosinophilic gastritis or gastroenteritis: Secondary | ICD-10-CM

## 2024-10-06 MED ORDER — DUPIXENT 300 MG/2ML SC SOAJ
300 mg | SUBCUTANEOUS | 3 refills | 28.00000 days | Status: AC
Start: 2024-10-06 — End: ?
